# Patient Record
Sex: Male | Born: 1955 | Race: White | Hispanic: No | Marital: Married | Smoking: Never smoker
Health system: Southern US, Community
[De-identification: ages and names within clinical notes are randomized; demographics above are authoritative.]

## PROBLEM LIST (undated history)

## (undated) DIAGNOSIS — E785 Hyperlipidemia, unspecified: Secondary | ICD-10-CM

## (undated) DIAGNOSIS — I509 Heart failure, unspecified: Secondary | ICD-10-CM

## (undated) DIAGNOSIS — G4730 Sleep apnea, unspecified: Secondary | ICD-10-CM

## (undated) DIAGNOSIS — I10 Essential (primary) hypertension: Secondary | ICD-10-CM

## (undated) DIAGNOSIS — G47419 Narcolepsy without cataplexy: Secondary | ICD-10-CM

## (undated) DIAGNOSIS — G629 Polyneuropathy, unspecified: Secondary | ICD-10-CM

## (undated) HISTORY — PX: OTHER SURGICAL HISTORY: SHX169

## (undated) HISTORY — PX: TARSAL TUNNEL RELEASE: SUR1099

## (undated) HISTORY — DX: Hyperlipidemia, unspecified: E78.5

## (undated) HISTORY — PX: TONSILLECTOMY: SUR1361

## (undated) HISTORY — PX: NASAL SEPTUM SURGERY: SHX37

## (undated) HISTORY — PX: BICEPS TENDON REPAIR: SHX566

## (undated) HISTORY — PX: VEIN LIGATION AND STRIPPING: SHX2653

## (undated) HISTORY — PX: UVULECTOMY: SHX2631

## (undated) HISTORY — DX: Heart failure, unspecified: I50.9

---

## 2004-04-09 ENCOUNTER — Ambulatory Visit: Payer: Self-pay | Admitting: Unknown Physician Specialty

## 2004-04-09 ENCOUNTER — Ambulatory Visit (HOSPITAL_BASED_OUTPATIENT_CLINIC_OR_DEPARTMENT_OTHER): Admission: RE | Admit: 2004-04-09 | Discharge: 2004-04-09 | Payer: Self-pay | Admitting: Unknown Physician Specialty

## 2004-04-09 ENCOUNTER — Encounter: Payer: Self-pay | Admitting: Pulmonary Disease

## 2005-03-31 ENCOUNTER — Encounter: Payer: Self-pay | Admitting: Pulmonary Disease

## 2005-08-16 ENCOUNTER — Encounter: Payer: Self-pay | Admitting: Pulmonary Disease

## 2005-10-06 ENCOUNTER — Encounter: Payer: Self-pay | Admitting: Pulmonary Disease

## 2006-03-06 ENCOUNTER — Encounter: Admission: RE | Admit: 2006-03-06 | Discharge: 2006-03-06 | Payer: Self-pay | Admitting: Family Medicine

## 2006-06-13 ENCOUNTER — Encounter: Payer: Self-pay | Admitting: Cardiology

## 2006-07-13 ENCOUNTER — Ambulatory Visit: Payer: Self-pay | Admitting: Gastroenterology

## 2006-07-28 ENCOUNTER — Ambulatory Visit: Payer: Self-pay | Admitting: Gastroenterology

## 2006-09-26 ENCOUNTER — Ambulatory Visit (HOSPITAL_COMMUNITY): Admission: RE | Admit: 2006-09-26 | Discharge: 2006-09-27 | Payer: Self-pay | Admitting: Orthopedic Surgery

## 2008-05-14 ENCOUNTER — Ambulatory Visit: Payer: Self-pay | Admitting: Pulmonary Disease

## 2008-05-14 DIAGNOSIS — G4710 Hypersomnia, unspecified: Secondary | ICD-10-CM | POA: Insufficient documentation

## 2008-05-14 DIAGNOSIS — Z9189 Other specified personal risk factors, not elsewhere classified: Secondary | ICD-10-CM | POA: Insufficient documentation

## 2008-05-14 DIAGNOSIS — J301 Allergic rhinitis due to pollen: Secondary | ICD-10-CM | POA: Insufficient documentation

## 2008-05-14 DIAGNOSIS — I10 Essential (primary) hypertension: Secondary | ICD-10-CM | POA: Insufficient documentation

## 2008-05-14 DIAGNOSIS — G4733 Obstructive sleep apnea (adult) (pediatric): Secondary | ICD-10-CM | POA: Insufficient documentation

## 2008-05-14 DIAGNOSIS — E785 Hyperlipidemia, unspecified: Secondary | ICD-10-CM | POA: Insufficient documentation

## 2008-05-16 ENCOUNTER — Telehealth: Payer: Self-pay | Admitting: Pulmonary Disease

## 2008-05-19 ENCOUNTER — Telehealth: Payer: Self-pay | Admitting: Pulmonary Disease

## 2008-06-25 ENCOUNTER — Telehealth (INDEPENDENT_AMBULATORY_CARE_PROVIDER_SITE_OTHER): Payer: Self-pay | Admitting: *Deleted

## 2009-05-17 ENCOUNTER — Encounter: Payer: Self-pay | Admitting: Cardiology

## 2009-05-18 ENCOUNTER — Encounter: Payer: Self-pay | Admitting: Cardiology

## 2009-05-19 ENCOUNTER — Encounter: Payer: Self-pay | Admitting: Cardiology

## 2009-05-20 ENCOUNTER — Encounter: Payer: Self-pay | Admitting: Cardiology

## 2009-05-21 ENCOUNTER — Encounter: Payer: Self-pay | Admitting: Cardiology

## 2009-06-04 ENCOUNTER — Encounter: Admission: RE | Admit: 2009-06-04 | Discharge: 2009-06-04 | Payer: Self-pay | Admitting: Unknown Physician Specialty

## 2009-06-18 ENCOUNTER — Ambulatory Visit: Payer: Self-pay | Admitting: Cardiology

## 2009-06-18 DIAGNOSIS — I4510 Unspecified right bundle-branch block: Secondary | ICD-10-CM | POA: Insufficient documentation

## 2009-07-13 ENCOUNTER — Ambulatory Visit: Payer: Self-pay

## 2009-07-13 ENCOUNTER — Ambulatory Visit (HOSPITAL_COMMUNITY): Admission: RE | Admit: 2009-07-13 | Discharge: 2009-07-13 | Payer: Self-pay | Admitting: Cardiology

## 2009-07-13 ENCOUNTER — Ambulatory Visit: Payer: Self-pay | Admitting: Cardiovascular Disease

## 2009-07-13 ENCOUNTER — Encounter: Payer: Self-pay | Admitting: Cardiology

## 2010-03-02 NOTE — Letter (Signed)
Summary: [facility] Services [facility] Information Update   Health Services Clinic Information Update   Imported By: Roderic Ovens 06/25/2009 15:59:08  _____________________________________________________________________  External Attachment:    Type:   Image     Comment:   External Document

## 2010-03-02 NOTE — Assessment & Plan Note (Signed)
Summary: np6/abnormal ekg/jss   Referring Provider:  Dr. Theadora Rama Primary Provider:  Dr. Theadora Rama  CC:  new patient.  abnormal ekg.  Pt states he is tired but this is the norm for him.  He takes a caffeine supplement every morning.  Marland Kitchen  History of Present Illness: 55 yo with history of HTN, hyperlipidemia, and OSA presents for evaluation of new RBBB on ECG.  This was noted at his yearly physical.  Prior ECG from 2008 showed normal QRS duration.  Patient has excellent exercise tolerance.  He does 30 minutes of cardiovascular exercise a day (walks on treadmill, uses elliptical, lifts weights).  He denies exertional dyspnea or chest pain.  No presyncope/syncope or palpitations.  His main problem has been long-standing (years) of feeling tired and sleepy during the day. He has seen multiple specialists and, despite treatment of OSA, has had no relief.    ECG: NSR, LAFB, RBBB  Labs (4/11): K 3.9, creatinine 1.4, LDL 114, HDL 56, TSH normal, HCT 44.6  Current Medications (verified): 1)  Allegra-D 12 Hour 60-120 Mg Xr12h-Tab (Fexofenadine-Pseudoephedrine) .... 1/2 Every Am 2)  Hyzaar 50-12.5 Mg Tabs (Losartan Potassium-Hctz) .Marland Kitchen.. 1 Every Am 3)  Nasonex 50 Mcg/act Susp (Mometasone Furoate) .Marland Kitchen.. 1 To 2 Sprays Each Nostril Every Am 4)  Androgel Pump 1 % Gel (Testosterone) .... As Directed Every Other Day 5)  Chest Congestion Relief 400 Mg Tabs (Guaifenesin) .Marland Kitchen.. 1 Two Times A Day As Needed 6)  Afrin Nasal Spray 0.05 % Soln (Oxymetazoline Hcl) .Marland Kitchen.. 1 Spray Each Nostril At Bedtime 7)  Aspirin Adult Low Strength 81 Mg Tbec (Aspirin) .Marland Kitchen.. 1 Once Daily 8)  Caffeine 400 Mg .Marland Kitchen.. 1 Every Am 9)  Saw Palmetto Complex  Caps (Zn-Pyg Afri-Nettle-Saw Palmet) .Marland Kitchen.. 1 Two Times A Day 10)  Ra Glucosamine-Chondroit-Msm 500-400-300 Mg Tabs (Glucosamine-Chondroitin-Msm) .Marland Kitchen.. 1 Two Times A Day 11)  Cpap 13 .... At Bedtime 12)  Sonata 10 Mg Caps (Zaleplon) .... Every Evening 13)  Trazodone Hcl 50 Mg Tabs  (Trazodone Hcl) .... Every Evening  Allergies (verified): No Known Drug Allergies  Past History:  Past Medical History: 1. Excessive Daytime Sleepiness: This has been a very difficult issue for years now. He has tried multiple interventions with multiple specialties but continues to have difficulty with sleepiness/fatigue.  2. ALLERGIC RHINITIS, SEASONAL (ICD-477.0) 3. HEADACHE, CHRONIC, HX OF (ICD-V15.9) 4. OBSTRUCTIVE SLEEP APNEA (ICD-327.23): Uses CPAP.  S/p uvalectomy and tonsillectomy.  5. HYPERTENSION (ICD-401.9) 6. HYPERLIPIDEMIA  7. ETT-cardiolite in 2004: EF 62%, no ischemia or infarction.   Family History: Allergies- Father No premature CAD.   Social History: Married with children, lives in [place] ETOH 2 x per wk Never smoked  Review of Systems       All systems reviewed and negative except as per HPI.   Vital Signs:  Patient profile:   55 year old male Height:      72 inches Weight:      279 pounds BMI:     37.98 Pulse rate:   82 / minute Pulse rhythm:   regular BP sitting:   130 / 96  (left arm) Cuff size:   large  Vitals Entered By: Judithe Modest CMA (Jun 18, 2009 11:15 AM)  Physical Exam  General:  Well developed, well nourished, in no acute distress.  Obese.  Head:  normocephalic and atraumatic Nose:  no deformity, discharge, inflammation, or lesions Mouth:  Teeth, gums and palate normal. Oral mucosa normal. Neck:  Neck thick, no JVD. No masses, thyromegaly or abnormal cervical nodes. Lungs:  Clear bilaterally to auscultation and percussion. Heart:  Non-displaced PMI, chest non-tender; regular rate and rhythm, S1, S2 without murmurs, rubs or gallops. Carotid upstroke normal, no bruit. Pedals normal pulses. No edema, no varicosities. Abdomen:  Bowel sounds positive; abdomen soft and non-tender without masses, organomegaly, or hernias noted. No hepatosplenomegaly. Msk:  Back normal, normal gait. Muscle strength and tone  normal. Extremities:  No clubbing or cyanosis. Neurologic:  Alert and oriented x 3. Skin:  Intact without lesions or rashes. Psych:  Normal affect.   Impression & Recommendations:  Problem # 1:  RIGHT BUNDLE BRANCH BLOCK (ICD-426.4) This is new compared to 2008.  Patient has no significant cardiovascular symptoms.  I suspect that this is a relatively benign finding (RBBB is not uncommon and in the absence of symptoms only gives a mildly increased risk of adverse cardiac events).  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  Would suggest echo to make sure that the heart is structurally normal.  Need to monitor with periodic (yearly) ECGs.  No further workup unless additional symptoms develop.    Other Orders: EKG w/ Interpretation (93000) Echocardiogram (Echo)  Patient Instructions: 1)  Your physician has requested that you have an echocardiogram.  Echocardiography is a painless test that uses sound waves to create images of your heart. It provides your doctor with information about the size and shape of your heart and how well your heart's chambers and valves are working.  This procedure takes approximately one hour. There are no restrictions for this procedure. 2)  Your physician recommends that you schedule a follow-up appointment as needed with Dr Shirlee Latch.

## 2010-03-02 NOTE — Letter (Signed)
Summary: Douglas Khan Physician Exam Report  [facility] Physician Exam Report   Imported By: Roderic Ovens 07/20/2009 10:02:36  _____________________________________________________________________  External Attachment:    Type:   Image     Comment:   External Document

## 2010-06-15 NOTE — Op Note (Signed)
Douglas Khan, CRESS NO.:  [id number]   MEDICAL RECORD NO.:  [id number]          PATIENT TYPE:  AMB   LOCATION:  SDS                          FACILITY:  [facility]   PHYSICIAN:  Dionne Ano. Gramig III, M.D.DATE OF BIRTH:  02-14-1955   DATE OF PROCEDURE:  DATE OF DISCHARGE:                               OPERATIVE REPORT   PREOPERATIVE DIAGNOSIS:  Distal biceps tendon rupture, right upper  extremity.   POSTOPERATIVE DIAGNOSIS:  Distal biceps tendon rupture, right upper  extremity.   PROCEDURE:  1. Tenolysis, right distal biceps tendon.  2. Repair distal biceps tendon rupture with EndoButton technique.  3. Stress radiography, right upper extremity.   ASSISTANT:  Karie Chimera, P.A.-C.   COMPLICATIONS:  None.   ANESTHESIA:  General.   TOURNIQUET TIME:  Less than an hour.   INDICATIONS FOR PROCEDURE:  This patient is a pleasant male who presents  the above-mentioned diagnosis.  He ruptured his tendon two weeks and two  days ago.  He has 8 cm of retraction.  I have discussed with him the  loss of supination, endurance and strength as well as flexion strength  with a chronic biceps rupture.  He understands the risks and benefits of  surgery and desires to proceed with the operative intervention noted  above.  I have discussed with him the risk of posterior interosseous  nerve injury, bleeding, infection, anesthesia and damage to normal  structures.  With all issues in mind, he desires to proceed and we will  proceed accordingly.   OPERATION IN DETAIL:  The patient was administered anesthesia.  He was  marked about the operative site.  He underwent prophylactic antibiotic  administration and was consented.  Following this, he was taken to the  procedural suite and underwent a general anesthetic.  He was laid supine  and properly padded.  He was prepped and draped in the sterile fashion  with Betadine scrub and paint about the right upper extremity.  Following  this, a sterile tourniquet was applied.  The tourniquet was  then insufflated to 250 mmHg.   An incision was made about the proximal forearm between the flexor and  extensor regions.  This was a volar Sherilyn Cooter approach well proximally.  The  patient had crossing veins taken down.  The lateral antebrachial  cutaneous nerve was carefully identified and protected.  It was swept  laterally during the course of the dissection.  I then dissected and  identified the distal biceps tendon rupture.  The biceps tendon was able  to gathered and freed.  I performed an extensive tenolysis of the tendon  and following this I placed a #2 FiberWire Krackow suture.  At the  distal end, an EndoButton was sutured for repair purposes/reinsertion  into the proximal radius.   At this time, two kite string sutures were placed in the outside eyelets  of the EndoButton and attention was turned towards the insertion.  Careful dissection was carried out and dissection about the crossing  veins and arteries was accomplished.  Ligaclips were placed about the  crossing veins and arterial  structures and the bony region about the  radius was then identified where the rupture occurred.  I very carefully  dissected in this region.  Following this, I placed a small bur about  the area for reinsertion.  The patient tolerated this well.  Following  this, I created an area for reinsertion and then placed a Beath pin.  The Beath pin was placed through the far cortex and then tapped through.  Following this, I then placed a reamer over the Beath pin to ream the  outside cortex to allow for EndoButton placement.  This was done to my  satisfaction without difficulty.  I then placed a kite string sutures  previously placed through the EndoButton through the eyelet of the Beath  pin and advanced the Beath pin into the distal radius insertion site of  the tendon, of course.  The patient tolerated this well.  I then placed  live fluoro  in the field and engaged the EndoButton.  One suture was  pulled through followed by the second.  This was done without difficulty  and the EndoButton seated beautifully.  The tendon had an excellent  repair.  I was pleased with this and the findings.   Following this, I placed 5 mL of Marcaine in the biceps muscle belly as  I wanted to prevent spasm.  The patient had the tourniquet deflated.  Hemostasis was obtained with bipolar electrocautery.  Assuring  hemostasis and irrigating copiously, the patient then had the wound  closed with Vicryl followed by subcuticular Prolene.  Steri-Strips were  then applied.  The exit site for the Beath pin needed no incision  closure, etc.  The patient had an excellent pulse.  I should note that  during the preparation of the insertion site the patient had copious  irrigation applied to lessen the risk of bone dust in the vicinity.  The  patient tolerated the procedure quite well and there were no  complicating features.   He was dressed sterilely and placed a long-arm splint.  All sponge,  needle and instrument counts were noted to be correct.  In the recovery  room, he was stable.  He will be monitored overnight and be given IV  antibiotics, pain management and general postop observation and measures  according to our protocol.   It has been an absolute pleasure to participate in his care and we look  forward to participating in his postop recovery.           ______________________________  Dionne Ano. Everlene Other, M.D.     Douglas Khan  D:  09/26/2006  T:  09/27/2006  Job:  [id number]

## 2010-06-18 NOTE — Procedures (Signed)
NAME:  Douglas Khan, Douglas Khan NO.:  [id number]   MEDICAL RECORD NO.:  [id number]          PATIENT TYPE:  OUT   LOCATION:  SLEEP CENTER                 FACILITY:  [facility]   PHYSICIAN:  Clinton D. Maple Hudson, M.D. DATE OF BIRTH:  Jan 08, 1956   DATE OF STUDY:  04/09/2004                              NOCTURNAL POLYSOMNOGRAM   REFERRING PHYSICIAN:  Dr. Theadora Rama.   INDICATIONS FOR STUDY:  Hypersomnia with sleep apnea. Epworth sleepiness  score 14/24, BMI 33, weight 250 pounds, neck size 17 inches. Past history of  surgical therapy for sleep apnea.   SLEEP ARCHITECTURE:  Total sleep time 421 minutes with sleep efficiency of  90%. Stage I was 9%, stage II was 57%, stages III and IV were 14%, REM was  20% of total sleep time. Sleep latency 9.5 minutes. REM latency 100.5  minutes. Awake after sleep onset 40 minutes. Arousal index 24. No  medications were recorded for sleep.   RESPIRATORY DATA:  Split study protocol. Respiratory disturbance index (RDI)  21.4 obstructive events per hour indicating moderate obstructive sleep  apnea/hypopnea before CPAP. This included 60 hypopneas before CPAP. Most  sleep was supine. REM RDI 16.4. CPAP was then titrated to a recommended  pressure of 9 CWP, RDI 3.7 per hour. A small Respironics Comfort Classic  nasal mask was used with a heated humidifier. The technician tried 10 CWP  which generated an RDI of 10.8 and may have begun to induce some vasomotor  nasal congestion with a higher air flow.   OXYGEN DATA:  Moderate snoring with oxygen desaturation to a nadir of 88%  before CPAP. After CPAP control saturation held 95% to 97% on room air.   CARDIAC DATA:  Normal sinus rhythm.   MOVEMENT/PARASOMNIA:  Occasional leg jerks with little effect on sleep.   IMPRESSION/RECOMMENDATIONS:  1.  Moderate obstructive sleep apnea/hypopnea syndrome, RDI 21.4 per hour      with oxygen desaturation to 88% and moderate snoring.  2.  Successful CPAP titration  to 9 CWP.  A small Respironics Comfort Classic      nasal mask was used with heated humidifier.    CDY/MEDQ  D:  04/18/2004 10:02:49  T:  04/19/2004 08:24:19  Job:  [id number]

## 2010-11-12 LAB — CBC
HCT: 47.4
Hemoglobin: 16.7
MCHC: 35.3
MCV: 89.8
Platelets: 265
RBC: 5.28
RDW: 12.6
WBC: 8.2

## 2010-11-12 LAB — URINALYSIS, ROUTINE W REFLEX MICROSCOPIC
Bilirubin Urine: NEGATIVE
Glucose, UA: NEGATIVE
Hgb urine dipstick: NEGATIVE
Ketones, ur: NEGATIVE
Protein, ur: NEGATIVE
Specific Gravity, Urine: 1.014
Urobilinogen, UA: 0.2

## 2010-11-12 LAB — BASIC METABOLIC PANEL
GFR calc non Af Amer: 53 — ABNORMAL LOW
Glucose, Bld: 100 — ABNORMAL HIGH

## 2011-06-10 ENCOUNTER — Ambulatory Visit
Admission: RE | Admit: 2011-06-10 | Discharge: 2011-06-10 | Disposition: A | Discharge status: Home / Self Care | Payer: BC Managed Care – PPO | Source: Ambulatory Visit | Attending: Diagnostic Neuroimaging | Admitting: Diagnostic Neuroimaging

## 2011-06-10 ENCOUNTER — Other Ambulatory Visit: Payer: Self-pay | Admitting: Diagnostic Neuroimaging

## 2011-06-10 DIAGNOSIS — G589 Mononeuropathy, unspecified: Secondary | ICD-10-CM

## 2011-09-07 ENCOUNTER — Encounter (HOSPITAL_COMMUNITY): Payer: Self-pay | Admitting: *Deleted

## 2011-09-07 NOTE — H&P (Signed)
Douglas Khan is an 56 y.o. male.   Chief Complaint: left knee pain HPI: The patient is a 56 year old male who presents with left knee pain. He reports that on Saturday September 03, 2011 he was walking down a grassy hill when he slipped and fell on both knees, hyperflexing both of them. He reports that he immediately had pain in the left knee and heard a loud pop. He has since had pain and swelling in the knee, and has been unable to extend it and bear weight on it. He has had some pain in the right knee but it is starting to fell better already. No swelling or instability in the right knee. No previous issues in the knees. He denies groin or lateral hip pain. No back pain. No numbness or tingling in the legs. Since then he has been seen at [facility] ED where they diagnosed him with a quad tendon tear in the left knee. He was placed in a knee immobilizer and on crutches.    Past Medical History Peripheral Neuropathy Sleep Apnea- uses CPAP Hypertension Hypercholesteremia Tarsal Tunnel Syndrome Chronic Pain Allergic Rhinitis Right Bundle Branch Block  Surgical History Straighten Nasal Septum Tonsillectomy Leg Circulation Surgery. bilateral Ankle Surgery. bilateral Foot Surgery. bilateral Vasectomy  Social History: Patient is a non-smoker who has never smoked. He occasionally drinks alcohol.   Allergies: NKDA  Medications TraMADol HCl (50MG  Tablet, Oral) Active AndroGel (50MG /5GM Gel, Transdermal) Active. Cialis (5MG  Tablet, Oral) Active. Mucinex (600MG  Tablet ER, Oral) Active. Nasonex (50MCG/ACT Suspension, Nasal) Active. Afrin Saline Nasal Mist (0.65% Solution, Nasal) Active. Losartan Potassium-HCTZ (50-12.5MG  Tablet, Oral) Active. Sonata (10MG  Capsule, Oral) Active. Adderall XR (15MG  Capsule ER 24HR, Oral) Active. Losartan Potassium (25MG  Tablet, Oral) Active. Aspirin EC (81MG  Tablet DR, Oral two times daily) Active.  Review of Systems  Constitutional: Negative.   HENT:  Negative.  Negative for neck pain.   Eyes: Negative.   Respiratory: Negative.   Cardiovascular: Negative.   Gastrointestinal: Negative.   Genitourinary: Negative.   Musculoskeletal: Positive for joint pain and falls. Negative for myalgias and back pain.  Skin: Negative.   Neurological: Negative.   Endo/Heme/Allergies: Negative.   Psychiatric/Behavioral: Negative.    Vitals BP: 143/87 HR: 82 RR: 18 Wt: 275 lbs Ht: 71 in BMI: 38.35  Physical Exam  Constitutional: He is oriented to person, place, and time. He appears well-developed and well-nourished. No distress.  HENT:  Head: Normocephalic and atraumatic.  Right Ear: External ear normal.  Left Ear: External ear normal.  Nose: Nose normal.  Mouth/Throat: Oropharynx is clear and moist.  Eyes: Conjunctivae and EOM are normal.  Neck: Normal range of motion. Neck supple. No tracheal deviation present. No thyromegaly present.  Cardiovascular: Normal rate, regular rhythm, normal heart sounds and intact distal pulses.   No murmur heard. Respiratory: Effort normal and breath sounds normal. No respiratory distress. He has no wheezes. He has no rales. He exhibits no tenderness.  GI: Soft. Bowel sounds are normal. He exhibits no distension and no mass. There is no tenderness. There is no rebound and no guarding.  Musculoskeletal:       Right hip: Normal.       Left hip: Normal.       Right knee: He exhibits normal range of motion, no swelling and no effusion. no tenderness found.       Left knee: He exhibits decreased range of motion, swelling, effusion and ecchymosis. tenderness found.       Right  ankle: Normal.       Left ankle: Normal.       Legs:      Feet:  Neurological: He is alert and oriented to person, place, and time. He has normal strength. No cranial nerve deficit or sensory deficit.  Skin: No rash noted. He is not diaphoretic. No erythema.  Psychiatric: He has a normal mood and affect. His behavior is normal. Judgment  and thought content normal.     Assessment/Plan He has a quadriceps tendon tear in the left knee. It will need surgical intervention. X-rays performed in the office showed no fractures in the left knee. We are going to get an MRI of the left knee this evening before surgery. Dr. Darrelyn Hillock will repair the left quadriceps tendon with tissue mend graft on Thursday September 08, 2011. The patient was instructed to remain in the knee immobilizer and crutches and to be NPO after midnight tonight.   Jonice Cerra LAUREN 09/07/2011, 4:32 PM

## 2011-09-08 ENCOUNTER — Ambulatory Visit (HOSPITAL_COMMUNITY): Payer: BC Managed Care – PPO | Admitting: Anesthesiology

## 2011-09-08 ENCOUNTER — Inpatient Hospital Stay (HOSPITAL_COMMUNITY)
Admission: RE | Admit: 2011-09-08 | Discharge: 2011-09-10 | DRG: 227 | Disposition: A | Discharge status: Home / Self Care | Payer: BC Managed Care – PPO | Source: Ambulatory Visit | Attending: Orthopedic Surgery | Admitting: Orthopedic Surgery

## 2011-09-08 ENCOUNTER — Encounter (HOSPITAL_COMMUNITY): Payer: Self-pay | Admitting: Anesthesiology

## 2011-09-08 ENCOUNTER — Encounter (HOSPITAL_COMMUNITY)
Admission: RE | Disposition: A | Discharge status: Home / Self Care | Payer: Self-pay | Source: Ambulatory Visit | Attending: Orthopedic Surgery

## 2011-09-08 DIAGNOSIS — Y9289 Other specified places as the place of occurrence of the external cause: Secondary | ICD-10-CM

## 2011-09-08 DIAGNOSIS — W19XXXA Unspecified fall, initial encounter: Secondary | ICD-10-CM | POA: Diagnosis present

## 2011-09-08 DIAGNOSIS — S76119A Strain of unspecified quadriceps muscle, fascia and tendon, initial encounter: Secondary | ICD-10-CM

## 2011-09-08 DIAGNOSIS — S86819A Strain of other muscle(s) and tendon(s) at lower leg level, unspecified leg, initial encounter: Secondary | ICD-10-CM | POA: Diagnosis present

## 2011-09-08 DIAGNOSIS — S838X9A Sprain of other specified parts of unspecified knee, initial encounter: Principal | ICD-10-CM | POA: Diagnosis present

## 2011-09-08 DIAGNOSIS — I10 Essential (primary) hypertension: Secondary | ICD-10-CM | POA: Diagnosis present

## 2011-09-08 DIAGNOSIS — G5790 Unspecified mononeuropathy of unspecified lower limb: Secondary | ICD-10-CM | POA: Diagnosis present

## 2011-09-08 DIAGNOSIS — G4730 Sleep apnea, unspecified: Secondary | ICD-10-CM | POA: Diagnosis present

## 2011-09-08 HISTORY — DX: Essential (primary) hypertension: I10

## 2011-09-08 HISTORY — DX: Narcolepsy without cataplexy: G47.419

## 2011-09-08 HISTORY — DX: Polyneuropathy, unspecified: G62.9

## 2011-09-08 HISTORY — DX: Sleep apnea, unspecified: G47.30

## 2011-09-08 HISTORY — PX: QUADRICEPS TENDON REPAIR: SHX756

## 2011-09-08 LAB — CBC
HCT: 43.7 % (ref 39.0–52.0)
Hemoglobin: 15.9 g/dL (ref 13.0–17.0)
MCH: 31.4 pg (ref 26.0–34.0)
MCHC: 36.4 g/dL — ABNORMAL HIGH (ref 30.0–36.0)
MCV: 86.4 fL (ref 78.0–100.0)
Platelets: 204 10*3/uL (ref 150–400)
RBC: 5.06 MIL/uL (ref 4.22–5.81)
RDW: 12.7 % (ref 11.5–15.5)
WBC: 8.3 10*3/uL (ref 4.0–10.5)

## 2011-09-08 LAB — COMPREHENSIVE METABOLIC PANEL
ALT: 29 U/L (ref 0–53)
AST: 47 U/L — ABNORMAL HIGH (ref 0–37)
Albumin: 3.8 g/dL (ref 3.5–5.2)
Alkaline Phosphatase: 65 U/L (ref 39–117)
BUN: 22 mg/dL (ref 6–23)
CO2: 22 mEq/L (ref 19–32)
Calcium: 9.1 mg/dL (ref 8.4–10.5)
Chloride: 102 mEq/L (ref 96–112)
Creatinine, Ser: 1.09 mg/dL (ref 0.50–1.35)
GFR calc Af Amer: 85 mL/min — ABNORMAL LOW (ref >90)
GFR calc non Af Amer: 73 mL/min — ABNORMAL LOW (ref >90)
Glucose, Bld: 102 mg/dL — ABNORMAL HIGH (ref 70–99)
Potassium: 3.5 mEq/L (ref 3.5–5.1)
Sodium: 136 mEq/L (ref 135–145)
Total Bilirubin: 1.2 mg/dL (ref 0.3–1.2)
Total Protein: 6.5 g/dL (ref 6.0–8.3)

## 2011-09-08 LAB — TYPE AND SCREEN
ABO/RH(D): O POS
Antibody Screen: NEGATIVE

## 2011-09-08 LAB — ABO/RH: ABO/RH(D): O POS

## 2011-09-08 LAB — PROTIME-INR
INR: 1.07 (ref 0.00–1.49)
Prothrombin Time: 14.1 seconds (ref 11.6–15.2)

## 2011-09-08 LAB — URINALYSIS, ROUTINE W REFLEX MICROSCOPIC
Bilirubin Urine: NEGATIVE
Glucose, UA: NEGATIVE mg/dL
Hgb urine dipstick: NEGATIVE
Ketones, ur: NEGATIVE mg/dL
Leukocytes, UA: NEGATIVE
Nitrite: NEGATIVE
Protein, ur: NEGATIVE mg/dL
Specific Gravity, Urine: 1.03 (ref 1.005–1.030)
Urobilinogen, UA: 0.2 mg/dL (ref 0.0–1.0)
pH: 5.5 (ref 5.0–8.0)

## 2011-09-08 LAB — APTT: aPTT: 34 seconds (ref 24–37)

## 2011-09-08 SURGERY — REPAIR, TENDON, QUADRICEPS
Anesthesia: General | Site: Leg Upper | Laterality: Left | Wound class: Clean

## 2011-09-08 MED ORDER — GELATIN ABSORBABLE MT POWD
OROMUCOSAL | Status: DC | PRN
  Administered 2011-09-08 (×2): via TOPICAL

## 2011-09-08 MED ORDER — BACITRACIN ZINC 500 UNIT/GM EX OINT
TOPICAL_OINTMENT | CUTANEOUS | Status: AC
  Filled 2011-09-08: qty 15

## 2011-09-08 MED ORDER — LACTATED RINGERS IV SOLN
INTRAVENOUS | Status: DC
  Administered 2011-09-08: via INTRAVENOUS
  Administered 2011-09-09: 20 mL/h via INTRAVENOUS

## 2011-09-08 MED ORDER — HYDROMORPHONE HCL PF 1 MG/ML IJ SOLN
INTRAMUSCULAR | Status: AC
  Administered 2011-09-09: 1 mg via INTRAVENOUS
  Filled 2011-09-08: qty 1

## 2011-09-08 MED ORDER — HYDROCHLOROTHIAZIDE 12.5 MG PO CAPS
12.5000 mg | ORAL_CAPSULE | Freq: Every day | ORAL | Status: DC
  Administered 2011-09-09: 12.5 mg via ORAL
  Filled 2011-09-08 (×2): qty 1

## 2011-09-08 MED ORDER — ACETAMINOPHEN 10 MG/ML IV SOLN
INTRAVENOUS | Status: DC | PRN
  Administered 2011-09-08: 1000 mg via INTRAVENOUS

## 2011-09-08 MED ORDER — METHOCARBAMOL 100 MG/ML IJ SOLN
500.0000 mg | Freq: Four times a day (QID) | INTRAVENOUS | Status: DC | PRN
  Filled 2011-09-08: qty 5

## 2011-09-08 MED ORDER — AMPHETAMINE-DEXTROAMPHET ER 5 MG PO CP24
15.0000 mg | ORAL_CAPSULE | Freq: Every day | ORAL | Status: DC
  Filled 2011-09-08 (×2): qty 3

## 2011-09-08 MED ORDER — TESTOSTERONE 50 MG/5GM (1%) TD GEL
5.0000 g | Freq: Every day | TRANSDERMAL | Status: DC
  Filled 2011-09-08 (×2): qty 5

## 2011-09-08 MED ORDER — HYDROCODONE-ACETAMINOPHEN 5-325 MG PO TABS
1.0000 | ORAL_TABLET | ORAL | Status: DC | PRN
  Administered 2011-09-08 – 2011-09-10 (×8): 2 via ORAL
  Filled 2011-09-08 (×8): qty 2

## 2011-09-08 MED ORDER — HYDROMORPHONE HCL PF 1 MG/ML IJ SOLN
1.0000 mg | INTRAMUSCULAR | Status: DC | PRN
  Administered 2011-09-08 – 2011-09-09 (×2): 1 mg via INTRAVENOUS
  Filled 2011-09-08: qty 1

## 2011-09-08 MED ORDER — CEFAZOLIN SODIUM 1-5 GM-% IV SOLN
INTRAVENOUS | Status: AC
  Filled 2011-09-08: qty 50

## 2011-09-08 MED ORDER — LOSARTAN POTASSIUM 50 MG PO TABS
50.0000 mg | ORAL_TABLET | Freq: Every day | ORAL | Status: DC
  Administered 2011-09-09: 50 mg via ORAL
  Filled 2011-09-08 (×2): qty 1

## 2011-09-08 MED ORDER — ACETAMINOPHEN 650 MG RE SUPP: 650.0000 mg | Freq: Four times a day (QID) | RECTAL | Status: DC | PRN

## 2011-09-08 MED ORDER — 0.9 % SODIUM CHLORIDE (POUR BTL) OPTIME
TOPICAL | Status: DC | PRN
  Administered 2011-09-08: 250 mL

## 2011-09-08 MED ORDER — BUPIVACAINE LIPOSOME 1.3 % IJ SUSP
20.0000 mL | Freq: Once | INTRAMUSCULAR | Status: AC
  Administered 2011-09-08: 19.99999999999999999 mL
  Filled 2011-09-08: qty 20

## 2011-09-08 MED ORDER — LOSARTAN POTASSIUM-HCTZ 50-12.5 MG PO TABS: 1.0000 | ORAL_TABLET | Freq: Every morning | ORAL | Status: DC

## 2011-09-08 MED ORDER — ONDANSETRON HCL 4 MG/2ML IJ SOLN
INTRAMUSCULAR | Status: DC | PRN
  Administered 2011-09-08: 4 mg via INTRAVENOUS

## 2011-09-08 MED ORDER — PHENOL 1.4 % MT LIQD: 1.0000 | OROMUCOSAL | Status: DC | PRN

## 2011-09-08 MED ORDER — HYDROMORPHONE HCL PF 1 MG/ML IJ SOLN
0.2500 mg | INTRAMUSCULAR | Status: DC | PRN
  Administered 2011-09-08 (×2): 0.5 mg via INTRAVENOUS

## 2011-09-08 MED ORDER — SUCCINYLCHOLINE CHLORIDE 20 MG/ML IJ SOLN
INTRAMUSCULAR | Status: DC | PRN
  Administered 2011-09-08: 100 mg via INTRAVENOUS

## 2011-09-08 MED ORDER — OXYMETAZOLINE HCL 0.05 % NA SOLN
2.0000 | Freq: Two times a day (BID) | NASAL | Status: DC
  Administered 2011-09-08 – 2011-09-09 (×2): 2 via NASAL
  Filled 2011-09-08: qty 15

## 2011-09-08 MED ORDER — BACITRACIN ZINC 500 UNIT/GM EX OINT
TOPICAL_OINTMENT | CUTANEOUS | Status: DC | PRN
  Administered 2011-09-08: 1 via TOPICAL

## 2011-09-08 MED ORDER — CEFAZOLIN SODIUM 10 G IJ SOLR
3.0000 g | INTRAMUSCULAR | Status: AC
  Administered 2011-09-08: 3 g via INTRAVENOUS
  Filled 2011-09-08: qty 3000

## 2011-09-08 MED ORDER — CEFAZOLIN SODIUM 1-5 GM-% IV SOLN
1.0000 g | Freq: Four times a day (QID) | INTRAVENOUS | Status: AC
  Administered 2011-09-08 – 2011-09-09 (×2): 1 g via INTRAVENOUS
  Filled 2011-09-08 (×2): qty 50

## 2011-09-08 MED ORDER — LACTATED RINGERS IV SOLN
INTRAVENOUS | Status: DC
  Administered 2011-09-08: 1000 mL via INTRAVENOUS
  Administered 2011-09-08: 18:00:00 via INTRAVENOUS

## 2011-09-08 MED ORDER — MUPIROCIN 2 % EX OINT
TOPICAL_OINTMENT | Freq: Two times a day (BID) | CUTANEOUS | Status: DC
Start: 2011-09-08 — End: 2011-09-08
  Filled 2011-09-08: qty 22

## 2011-09-08 MED ORDER — ONDANSETRON HCL 4 MG PO TABS: 4.0000 mg | ORAL_TABLET | Freq: Four times a day (QID) | ORAL | Status: DC | PRN

## 2011-09-08 MED ORDER — FENTANYL CITRATE 0.05 MG/ML IJ SOLN
INTRAMUSCULAR | Status: DC | PRN
  Administered 2011-09-08: 100 ug via INTRAVENOUS
  Administered 2011-09-08: 150 ug via INTRAVENOUS
  Administered 2011-09-08: 100 ug via INTRAVENOUS

## 2011-09-08 MED ORDER — CHLORHEXIDINE GLUCONATE 4 % EX LIQD
60.0000 mL | Freq: Once | CUTANEOUS | Status: DC
  Filled 2011-09-08: qty 60

## 2011-09-08 MED ORDER — LIDOCAINE HCL (CARDIAC) 20 MG/ML IV SOLN
INTRAVENOUS | Status: DC | PRN
  Administered 2011-09-08: 100 mg via INTRAVENOUS

## 2011-09-08 MED ORDER — ONDANSETRON HCL 4 MG/2ML IJ SOLN: 4.0000 mg | Freq: Four times a day (QID) | INTRAMUSCULAR | Status: DC | PRN

## 2011-09-08 MED ORDER — LACTATED RINGERS IV SOLN: INTRAVENOUS | Status: DC

## 2011-09-08 MED ORDER — OXYCODONE-ACETAMINOPHEN 5-325 MG PO TABS: 2.0000 | ORAL_TABLET | ORAL | Status: DC | PRN

## 2011-09-08 MED ORDER — PROMETHAZINE HCL 25 MG/ML IJ SOLN: 6.2500 mg | INTRAMUSCULAR | Status: DC | PRN

## 2011-09-08 MED ORDER — POLYETHYLENE GLYCOL 3350 17 G PO PACK: 17.0000 g | PACK | Freq: Every day | ORAL | Status: DC | PRN

## 2011-09-08 MED ORDER — LOSARTAN POTASSIUM 25 MG PO TABS
25.0000 mg | ORAL_TABLET | Freq: Every evening | ORAL | Status: DC
  Administered 2011-09-08 – 2011-09-09 (×2): 25 mg via ORAL
  Filled 2011-09-08 (×3): qty 1

## 2011-09-08 MED ORDER — SODIUM CHLORIDE 0.9 % IR SOLN
Status: DC | PRN
  Administered 2011-09-08: 16:00:00

## 2011-09-08 MED ORDER — PROPOFOL 10 MG/ML IV BOLUS
INTRAVENOUS | Status: DC | PRN
  Administered 2011-09-08: 50 mg via INTRAVENOUS
  Administered 2011-09-08: 300 mg via INTRAVENOUS

## 2011-09-08 MED ORDER — ALUM & MAG HYDROXIDE-SIMETH 200-200-20 MG/5ML PO SUSP: 30.0000 mL | ORAL | Status: DC | PRN

## 2011-09-08 MED ORDER — METHOCARBAMOL 500 MG PO TABS
500.0000 mg | ORAL_TABLET | Freq: Four times a day (QID) | ORAL | Status: DC | PRN
  Administered 2011-09-08 – 2011-09-10 (×4): 500 mg via ORAL
  Filled 2011-09-08 (×4): qty 1

## 2011-09-08 MED ORDER — KETAMINE HCL 10 MG/ML IJ SOLN
INTRAMUSCULAR | Status: DC | PRN
  Administered 2011-09-08: 30 mg via INTRAVENOUS
  Administered 2011-09-08 (×2): 10 mg via INTRAVENOUS

## 2011-09-08 MED ORDER — HYDROMORPHONE HCL PF 1 MG/ML IJ SOLN
INTRAMUSCULAR | Status: DC | PRN
  Administered 2011-09-08 (×5): .4 mg via INTRAVENOUS

## 2011-09-08 MED ORDER — CEFAZOLIN SODIUM-DEXTROSE 2-3 GM-% IV SOLR
INTRAVENOUS | Status: AC
  Filled 2011-09-08: qty 50

## 2011-09-08 MED ORDER — THROMBIN 5000 UNITS EX SOLR
CUTANEOUS | Status: AC
  Filled 2011-09-08: qty 10000

## 2011-09-08 MED ORDER — ACETAMINOPHEN 10 MG/ML IV SOLN
INTRAVENOUS | Status: AC
  Filled 2011-09-08: qty 100

## 2011-09-08 MED ORDER — AMPHETAMINE-DEXTROAMPHET ER 15 MG PO CP24: 15.0000 mg | ORAL_CAPSULE | ORAL | Status: DC

## 2011-09-08 MED ORDER — BISACODYL 10 MG RE SUPP: 10.0000 mg | Freq: Every day | RECTAL | Status: DC | PRN

## 2011-09-08 MED ORDER — RIVAROXABAN 10 MG PO TABS
10.0000 mg | ORAL_TABLET | Freq: Every day | ORAL | Status: DC
  Administered 2011-09-09 – 2011-09-10 (×2): 10 mg via ORAL
  Filled 2011-09-08 (×4): qty 1

## 2011-09-08 MED ORDER — FLUTICASONE PROPIONATE 50 MCG/ACT NA SUSP
1.0000 | Freq: Every day | NASAL | Status: DC
  Administered 2011-09-09: 1 via NASAL
  Filled 2011-09-08: qty 16

## 2011-09-08 MED ORDER — ACETAMINOPHEN 325 MG PO TABS: 650.0000 mg | ORAL_TABLET | Freq: Four times a day (QID) | ORAL | Status: DC | PRN

## 2011-09-08 MED ORDER — DEXAMETHASONE SODIUM PHOSPHATE 10 MG/ML IJ SOLN
INTRAMUSCULAR | Status: DC | PRN
  Administered 2011-09-08: 10 mg via INTRAVENOUS

## 2011-09-08 MED ORDER — MENTHOL 3 MG MT LOZG: 1.0000 | LOZENGE | OROMUCOSAL | Status: DC | PRN

## 2011-09-08 SURGICAL SUPPLY — 58 items
BAG SPEC THK2 15X12 ZIP CLS (MISCELLANEOUS) ×1
BAG ZIPLOCK 12X15 (MISCELLANEOUS) ×2 IMPLANT
BANDAGE ELASTIC 4 VELCRO ST LF (GAUZE/BANDAGES/DRESSINGS) ×2 IMPLANT
BANDAGE ELASTIC 6 VELCRO ST LF (GAUZE/BANDAGES/DRESSINGS) ×2 IMPLANT
BANDAGE GAUZE ELAST BULKY 4 IN (GAUZE/BANDAGES/DRESSINGS) ×2 IMPLANT
BIT DRILL 2.8X128 (BIT) ×2 IMPLANT
BNDG COHESIVE 4X5 TAN NS LF (GAUZE/BANDAGES/DRESSINGS) ×1 IMPLANT
CLOTH BEACON ORANGE TIMEOUT ST (SAFETY) ×2 IMPLANT
CLSR STERI-STRIP ANTIMIC 1/2X4 (GAUZE/BANDAGES/DRESSINGS) ×1 IMPLANT
CUFF TOURN SGL QUICK 34 (TOURNIQUET CUFF) ×2
CUFF TRNQT CYL 34X4X40X1 (TOURNIQUET CUFF) ×1 IMPLANT
DRAPE EXTREMITY T 121X128X90 (DRAPE) ×2 IMPLANT
DRAPE U-SHAPE 47X51 STRL (DRAPES) ×2 IMPLANT
DRSG ADAPTIC 3X8 NADH LF (GAUZE/BANDAGES/DRESSINGS) ×2 IMPLANT
DRSG PAD ABDOMINAL 8X10 ST (GAUZE/BANDAGES/DRESSINGS) ×4 IMPLANT
DURAPREP 26ML APPLICATOR (WOUND CARE) ×2 IMPLANT
ELECT REM PT RETURN 9FT ADLT (ELECTROSURGICAL) ×2
ELECTRODE REM PT RTRN 9FT ADLT (ELECTROSURGICAL) ×1 IMPLANT
GLOVE BIOGEL PI IND STRL 8 (GLOVE) ×1 IMPLANT
GLOVE BIOGEL PI IND STRL 8.5 (GLOVE) ×1 IMPLANT
GLOVE BIOGEL PI INDICATOR 8 (GLOVE) ×1
GLOVE BIOGEL PI INDICATOR 8.5 (GLOVE)
GLOVE ECLIPSE 8.0 STRL XLNG CF (GLOVE) ×4 IMPLANT
GOWN PREVENTION PLUS LG XLONG (DISPOSABLE) ×3 IMPLANT
GOWN STRL REIN XL XLG (GOWN DISPOSABLE) ×4 IMPLANT
IMMOBILIZER KNEE 20 (SOFTGOODS) ×2
IMMOBILIZER KNEE 20 THIGH 36 (SOFTGOODS) ×1 IMPLANT
IMMOBILIZER KNEE 22 UNIV (SOFTGOODS) ×1 IMPLANT
KIT BASIN OR (CUSTOM PROCEDURE TRAY) ×2 IMPLANT
MANIFOLD NEPTUNE II (INSTRUMENTS) ×1 IMPLANT
NDL MA TROC 1/2 CIR (NEEDLE) ×1 IMPLANT
NDL SPNL 18GX3.5 QUINCKE PK (NEEDLE) ×1 IMPLANT
NEEDLE HYPO 22GX1.5 SAFETY (NEEDLE) ×2 IMPLANT
NEEDLE MA TROC 1/2 CIR (NEEDLE) IMPLANT
NEEDLE SPNL 18GX3.5 QUINCKE PK (NEEDLE) IMPLANT
NS IRRIG 1000ML POUR BTL (IV SOLUTION) ×2 IMPLANT
PACK TOTAL JOINT (CUSTOM PROCEDURE TRAY) ×2 IMPLANT
PASSER SUT SWANSON 36MM LOOP (INSTRUMENTS) ×2 IMPLANT
PATCH TISSUE MEND 5X6CM (Orthopedic Implant) ×1 IMPLANT
POSITIONER SURGICAL ARM (MISCELLANEOUS) ×2 IMPLANT
SPONGE GAUZE 4X4 12PLY (GAUZE/BANDAGES/DRESSINGS) ×2 IMPLANT
STAPLER VISISTAT 35W (STAPLE) ×1 IMPLANT
STOCKINETTE 8 INCH (MISCELLANEOUS) ×1 IMPLANT
SUT ETHIBOND 2 OS 4 DA (SUTURE) ×6 IMPLANT
SUT ETHIBOND 5 LR DA (SUTURE) ×2 IMPLANT
SUT FIBERWIRE #2 38 T-5 BLUE (SUTURE)
SUT MNCRL 3 0 RB1 (SUTURE) IMPLANT
SUT MONOCRYL 3 0 RB1 (SUTURE) ×1
SUT VIC AB 0 CT1 27 (SUTURE) ×4
SUT VIC AB 0 CT1 27XBRD ANTBC (SUTURE) IMPLANT
SUT VIC AB 1 CT1 27 (SUTURE) ×8
SUT VIC AB 1 CT1 27XBRD ANTBC (SUTURE) IMPLANT
SUT VIC AB 2-0 CT1 27 (SUTURE) ×4
SUT VIC AB 2-0 CT1 27XBRD (SUTURE) IMPLANT
SUTURE FIBERWR #2 38 T-5 BLUE (SUTURE) IMPLANT
SYR 30ML LL (SYRINGE) ×2 IMPLANT
TOWEL OR 17X26 10 PK STRL BLUE (TOWEL DISPOSABLE) ×4 IMPLANT
WATER STERILE IRR 1500ML POUR (IV SOLUTION) ×3 IMPLANT

## 2011-09-08 NOTE — Interval H&P Note (Signed)
History and Physical Interval Note:  09/08/2011 4:44 PM  [name]  has presented today for surgery, with the diagnosis of left quad tendon rupture  The various methods of treatment have been discussed with the patient and family. After consideration of risks, benefits and other options for treatment, the patient has consented to  Procedure(s) (LRB): REPAIR QUADRICEP TENDON (Left) as Khan surgical intervention .  The patient's history has been reviewed, patient examined, no change in status, stable for surgery.  I have reviewed the patient's chart and labs.  Questions were answered to the patient's satisfaction.     Douglas Khan

## 2011-09-08 NOTE — Anesthesia Preprocedure Evaluation (Addendum)
Anesthesia Evaluation  Patient identified by MRN, date of birth, ID band Patient awake    Reviewed: Allergy & Precautions, H&P , NPO status , Patient's Chart, lab work & pertinent test results  Airway Mallampati: II TM Distance: >3 FB Neck ROM: Full    Dental  (+) Teeth Intact, Chipped and Caps,    Pulmonary sleep apnea and Continuous Positive Airway Pressure Ventilation ,  breath sounds clear to auscultation  Pulmonary exam normal       Cardiovascular hypertension, Pt. on medications Rhythm:Regular Rate:Normal     Neuro/Psych Narcolepsy  Neuromuscular disease negative psych ROS   GI/Hepatic negative GI ROS, Neg liver ROS,   Endo/Other  negative endocrine ROSMorbid obesity  Renal/GU negative Renal ROS  negative genitourinary   Musculoskeletal negative musculoskeletal ROS (+)   Abdominal   Peds negative pediatric ROS (+)  Hematology negative hematology ROS (+)   Anesthesia Other Findings   Reproductive/Obstetrics negative OB ROS                          Anesthesia Physical Anesthesia Plan  ASA: III  Anesthesia Plan: General   Post-op Pain Management:    Induction: Intravenous  Airway Management Planned: Oral ETT  Additional Equipment:   Intra-op Plan:   Post-operative Plan: Extubation in OR  Informed Consent: I have reviewed the patients History and Physical, chart, labs and discussed the procedure including the risks, benefits and alternatives for the proposed anesthesia with the patient or authorized representative who has indicated his/her understanding and acceptance.   Dental advisory given  Plan Discussed with: CRNA  Anesthesia Plan Comments:         Anesthesia Quick Evaluation

## 2011-09-08 NOTE — Anesthesia Procedure Notes (Signed)
Procedure Name: Intubation Date/Time: 09/08/2011 4:56 PM Performed by: Jantzen Libman L Patient Re-evaluated:Patient Re-evaluated prior to inductionOxygen Delivery Method: Circle system utilized Preoxygenation: Pre-oxygenation with 100% oxygen Intubation Type: IV induction Ventilation: Mask ventilation without difficulty and Oral airway inserted - appropriate to patient size Laryngoscope Size: Miller and 3 Grade View: Grade I Tube size: 8.0 mm Number of attempts: 1 Airway Equipment and Method: Stylet Placement Confirmation: ETT inserted through vocal cords under direct vision,  breath sounds checked- equal and bilateral and positive ETCO2 Secured at: 23 cm Tube secured with: Tape Dental Injury: Teeth and Oropharynx as per pre-operative assessment

## 2011-09-08 NOTE — Preoperative (Signed)
Beta Blockers   Reason not to administer Beta Blockers:Not Applicable 

## 2011-09-08 NOTE — Transfer of Care (Signed)
Immediate Anesthesia Transfer of Care Note  Patient: Douglas Khan  Procedure(s) Performed: Procedure(s) (LRB): REPAIR QUADRICEP TENDON (Left)  Patient Location: PACU  Anesthesia Type: General  Level of Consciousness: awake, alert  and oriented  Airway & Oxygen Therapy: Patient Spontanous Breathing and Patient connected to face mask oxygen  Post-op Assessment: Report given to PACU RN and Post -op Vital signs reviewed and stable  Post vital signs: Reviewed and stable  Complications: No apparent anesthesia complications

## 2011-09-09 ENCOUNTER — Encounter (HOSPITAL_COMMUNITY): Payer: Self-pay | Admitting: Orthopedic Surgery

## 2011-09-09 MED ORDER — METHOCARBAMOL 500 MG PO TABS: 500.0000 mg | ORAL_TABLET | Freq: Four times a day (QID) | ORAL | Status: AC | PRN

## 2011-09-09 MED ORDER — OXYCODONE-ACETAMINOPHEN 5-325 MG PO TABS: 1.0000 | ORAL_TABLET | ORAL | Status: AC | PRN

## 2011-09-09 MED ORDER — RIVAROXABAN 10 MG PO TABS: 10.0000 mg | ORAL_TABLET | Freq: Every day | ORAL | Status: DC

## 2011-09-09 NOTE — Progress Notes (Signed)
Utilization review completed.  

## 2011-09-09 NOTE — Evaluation (Deleted)
Physical Therapy Evaluation Patient Details Name: Douglas Khan MRN: [id number] DOB: Jul 27, 1955 Today's Date: 09/09/2011 Time: 4098-1191 PT Time Calculation (min): 31 min  PT Assessment / Plan / Recommendation Clinical Impression  s/p left quad tendon repair; will benefit  from PT to maximize independence for home    PT Assessment  Patient needs continued PT services    Follow Up Recommendations       Barriers to Discharge        Equipment Recommendations  None recommended by PT    Recommendations for Other Services     Frequency Min 6X/week    Precautions / Restrictions Precautions Precautions: Other (comment) Precaution Comments: No ROM right knee Required Braces or Orthoses: Knee Immobilizer - Left Knee Immobilizer - Left: On at all times Restrictions Weight Bearing Restrictions: No LLE Weight Bearing: Weight bearing as tolerated   Pertinent Vitals/Pain       Mobility  Bed Mobility Bed Mobility: Sit to Supine Sit to Supine: 4: Min assist Details for Bed Mobility Assistance: cues for technique Transfers Transfers: Sit to Stand;Stand to Sit Sit to Stand: 4: Min guard Stand to Sit: 4: Min guard;4: Min assist Details for Transfer Assistance: cues for LLE position Ambulation/Gait Ambulation/Gait Assistance: 4: Min guard Ambulation Distance (Feet): 250 Feet Assistive device: Rolling walker Ambulation/Gait Assistance Details: cues for sequence Gait Pattern: Step-through pattern    Exercises     PT Diagnosis: Difficulty walking  PT Problem List: Decreased strength;Decreased activity tolerance;Decreased balance;Decreased mobility;Decreased knowledge of precautions;Decreased knowledge of use of DME PT Treatment Interventions: DME instruction;Gait training;Stair training;Functional mobility training;Therapeutic activities;Patient/family education   PT Goals Acute Rehab PT Goals PT Goal Formulation: With patient Time For Goal Achievement: 09/12/11 Potential to  Achieve Goals: Good  Visit Information  Last PT Received On: 09/09/11 Assistance Needed: +1    Subjective Data  Subjective: it is chronic pain Patient Stated Goal: home tomorrow   Prior Functioning  Home Living Lives With: Spouse Available Help at Discharge: Available PRN/intermittently Type of Home: House Home Access: Stairs to enter [employer] of Steps: 2 Entrance Stairs-Rails: None Home Layout: One level;Able to live on main level with bedroom/bathroom Home Adaptive Equipment: Crutches Prior Function Level of Independence: Independent Communication Communication: No difficulties    Cognition  Overall Cognitive Status: Appears within functional limits for tasks assessed/performed Arousal/Alertness: Awake/alert Orientation Level: Appears intact for tasks assessed Behavior During Session: [facility] for tasks performed    Extremity/Trunk Assessment Right Upper Extremity Assessment RUE ROM/Strength/Tone: [facility] for tasks assessed Left Upper Extremity Assessment LUE ROM/Strength/Tone: [facility] for tasks assessed Right Lower Extremity Assessment RLE ROM/Strength/Tone: [facility] for tasks assessed Left Lower Extremity Assessment LLE ROM/Strength/Tone: Unable to fully assess;Due to precautions   Balance    End of Session PT - End of Session Equipment Utilized During Treatment: Left knee immobilizer Activity Tolerance: Patient tolerated treatment well Patient left: in chair;with call bell/phone within reach;with family/visitor present Nurse Communication: Mobility status  GP     [facility] 09/09/2011, 10:32 AM

## 2011-09-09 NOTE — Progress Notes (Signed)
09/09/11 1400  PT Visit Information  Last PT Received On 09/09/11  Assistance Needed +1  PT Time Calculation  PT Start Time 1336  PT Stop Time 1357  PT Time Calculation (min) 21 min  Subjective Data  Subjective sore  Patient Stated Goal home tomorrow  Precautions  Precautions Other (comment)  Precaution Comments No ROM LEFT knee  Required Braces or Orthoses Knee Immobilizer - Left  Knee Immobilizer - Left On at all times  Restrictions  LLE Weight Bearing WBAT  Cognition  Overall Cognitive Status Appears within functional limits for tasks assessed/performed  Arousal/Alertness Awake/alert  Orientation Level Appears intact for tasks assessed  Behavior During Session [facility] for tasks performed  Bed Mobility  Bed Mobility Supine to Sit;Sit to Supine  Supine to Sit 4: Min assist;HOB flat  Sit to Supine 4: Min assist  Details for Bed Mobility Assistance cues for technique (X2)  Transfers  Transfers Sit to Stand;Stand to Sit  Sit to Stand 5: Supervision;From bed;With upper extremity assist  Stand to Sit 5: Supervision;With upper extremity assist;To bed  Details for Transfer Assistance min VCs for hand placement and LLE position.  Ambulation/Gait  Ambulation/Gait Assistance 5: Supervision  Ambulation Distance (Feet) 200 Feet  Assistive device Rolling walker  Ambulation/Gait Assistance Details cues for sequence  Gait Pattern Step-through pattern  Total Joint Exercises  Ankle Circles/Pumps (pt doing independently)  PT - End of Session  Equipment Utilized During Treatment Left knee immobilizer  Activity Tolerance Patient tolerated treatment well  Patient left in bed;with call bell/phone within reach;with family/visitor present  Nurse Communication Mobility status  PT - Assessment/Plan  Comments on Treatment Session pt progressing well  PT Plan Discharge plan remains appropriate;Frequency remains appropriate  PT Frequency Min 6X/week  Follow Up Recommendations No PT follow up    Equipment Recommended Toilet riser;Other (comment)  Acute Rehab PT Goals  Time For Goal Achievement 09/12/11  Potential to Achieve Goals Good  PT General Charges  $$ ACUTE PT VISIT 1 Procedure  PT Treatments  $Gait Training 8-22 mins

## 2011-09-09 NOTE — Op Note (Signed)
NAMEQUINTERIOUS, WALRAVEN NO.:  [id number]  MEDICAL RECORD NO.:  [id number]  LOCATION:  1614                         FACILITY:  [facility]  PHYSICIAN:  Georges Lynch. Ha Shannahan, M.D.DATE OF BIRTH:  [birth year]  DATE OF PROCEDURE:  09/08/2011 DATE OF DISCHARGE:                              OPERATIVE REPORT   SURGEON:  Georges Lynch. Ricky Gallery, MD  ASSISTANT:  Dimitri Ped, PA  PREOPERATIVE DIAGNOSIS:  Complete rupture of the quadriceps tendon from the left patella.  POSTOPERATIVE DIAGNOSIS:  Complete rupture of the quadriceps tendon from the left patella.  OPERATION: 1. Open repair of a complete rupture of the patellar quadriceps tendon     through the patella. 2. TissueMend graft, utilized large 5 x 5 TissueMend graft.  PROCEDURE IN DETAIL:  Under general anesthesia, routine orthopedic prep and draping of the left lower extremity was carried out.  Note, prior to surgery, I marked the appropriate left leg in the holding area.  The tourniquet was applied.  The leg was exsanguinated with an Esmarch.  The tourniquet was elevated at 325 mmHg.  At this time, the appropriate time- out was carried out prior to doing any surgery as I mentioned, we also marked the appropriate left leg in the holding area.  Following that, I then made an anterior approach of the left knee.  Bleeders identified and cauterized.  Immediately upon going down to the subcu tissue, there was a large hematoma that we evacuated.  We inspected the patella on the joint region and there was a complete rupture of the medulla of the patellar tendon, there was a small portion in the midline that was still intact.  We thoroughly irrigated out the area, and at this particular time, two drill holes were made in the patella.  We then utilized #5 Ethibond suture and repaired the tendon to the patella by way of 2 drill holes and tied sutures distally.  Following that, I then repaired the medial and lateral aspects of the  quadriceps tendon tear.  I then applied a TissueMend graft over the repair site in the usual fashion.  We then irrigated the wound out again.  I loosely applied some thrombin-soaked Gelfoam and closed the wound in the usual fashion.  We closed the subcu with Monocryl suture.  Sterile Neosporin dressing applied.  The patient was placed in a knee immobilizer.  He had 3 g of IV Ancef preop.          ______________________________ Georges Lynch. Darrelyn Hillock, M.D.     RAG/MEDQ  D:  09/08/2011  T:  09/09/2011  Job:  [id number]  cc:   Barbaraann Share, MD,FCCP [address] [place] [address]

## 2011-09-09 NOTE — Evaluation (Signed)
Physical Therapy Evaluation Patient Details Name: Douglas Khan MRN: [id number] DOB: 05-16-1955 Today's Date: 09/09/2011 Time: 4098-1191 PT Time Calculation (min): 31 min  PT Assessment / Plan / Recommendation Clinical Impression  s/p left quad tendon repair; will benefit  from PT to maximize independence for home    PT Assessment  Patient needs continued PT services    Follow Up Recommendations    OP PT per MD after f/u visit at office   Barriers to Discharge        Equipment Recommendations  None recommended by PT    Recommendations for Other Services     Frequency Min 6X/week    Precautions / Restrictions Precautions Precautions: Other (comment) Precaution Comments: No ROM right knee Required Braces or Orthoses: Knee Immobilizer - Left Knee Immobilizer - Left: On at all times Restrictions Weight Bearing Restrictions: No LLE Weight Bearing: Weight bearing as tolerated   Pertinent Vitals/Pain      Mobility  Bed Mobility Bed Mobility: Sit to Supine Sit to Supine: 4: Min assist Details for Bed Mobility Assistance: cues for technique Transfers Transfers: Sit to Stand;Stand to Sit Sit to Stand: 4: Min guard Stand to Sit: 4: Min guard;4: Min assist Details for Transfer Assistance: cues for LLE position Ambulation/Gait Ambulation/Gait Assistance: 4: Min guard Ambulation Distance (Feet): 250 Feet Assistive device: Rolling walker Ambulation/Gait Assistance Details: cues for sequence Gait Pattern: Step-through pattern    Exercises     PT Diagnosis: Difficulty walking  PT Problem List: Decreased strength;Decreased activity tolerance;Decreased balance;Decreased mobility;Decreased knowledge of precautions;Decreased knowledge of use of DME PT Treatment Interventions: DME instruction;Gait training;Stair training;Functional mobility training;Therapeutic activities;Patient/family education   PT Goals Acute Rehab PT Goals PT Goal Formulation: With patient Time For Goal  Achievement: 09/12/11 Potential to Achieve Goals: Good  Visit Information  Last PT Received On: 09/09/11 Assistance Needed: +1    Subjective Data  Subjective: it is chronic pain Patient Stated Goal: home tomorrow   Prior Functioning  Home Living Lives With: Spouse Available Help at Discharge: Available PRN/intermittently Type of Home: House Home Access: Stairs to enter [employer] of Steps: 2 Entrance Stairs-Rails: None Home Layout: One level;Able to live on main level with bedroom/bathroom Home Adaptive Equipment: Crutches Prior Function Level of Independence: Independent Communication Communication: No difficulties    Cognition  Overall Cognitive Status: Appears within functional limits for tasks assessed/performed Arousal/Alertness: Awake/alert Orientation Level: Appears intact for tasks assessed Behavior During Session: [facility] for tasks performed    Extremity/Trunk Assessment Right Upper Extremity Assessment RUE ROM/Strength/Tone: [facility] for tasks assessed Left Upper Extremity Assessment LUE ROM/Strength/Tone: [facility] for tasks assessed Right Lower Extremity Assessment RLE ROM/Strength/Tone: [facility] for tasks assessed Left Lower Extremity Assessment LLE ROM/Strength/Tone: Unable to fully assess;Due to precautions   Balance    End of Session PT - End of Session Equipment Utilized During Treatment: Left knee immobilizer Activity Tolerance: Patient tolerated treatment well Patient left: in chair;with call bell/phone within reach;with family/visitor present Nurse Communication: Mobility status  GP     [facility] 09/09/2011, 10:33 AM

## 2011-09-09 NOTE — Evaluation (Signed)
Occupational Therapy Evaluation Patient Details Name: Douglas Khan MRN: [id number] DOB: 1955-07-21 Today's Date: 09/09/2011 Time: 4098-1191 OT Time Calculation (min): 12 min  OT Assessment / Plan / Recommendation Clinical Impression  Pt doing very well POD 1 quad tenon repair L. All education completed. Pt will have necessary level of A upon d/c.    OT Assessment  Patient does not need any further OT services    Follow Up Recommendations  No OT follow up    Barriers to Discharge      Equipment Recommendations  Toilet riser;Other (comment) (Pt currently refusing.)    Recommendations for Other Services    Frequency       Precautions / Restrictions Precautions Precautions: Other (comment) Precaution Comments: No ROM R knee Required Braces or Orthoses: Knee Immobilizer - Left Knee Immobilizer - Left: On at all times Restrictions Weight Bearing Restrictions: No LLE Weight Bearing: Weight bearing as tolerated   Pertinent Vitals/Pain     ADL  Grooming: Simulated;Supervision/safety Where Assessed - Grooming: Supported standing Toilet Transfer: Research scientist (life sciences) Method: Sit to Barista: Regular height toilet Toileting - Architect and Hygiene: Simulated;Supervision/safety Where Assessed - Engineer, mining and Hygiene: Sit to stand from 3-in-1 or toilet Tub/Shower Transfer: Performed;Supervision/safety Tub/Shower Transfer Method: Science writer: Walk in Scientist, research (physical sciences) Used: Rolling walker ADL Comments: Pt was fully dressed upon OTs arrival and states that he had no difficulties. Pt would likely benefit from a toilet riser or 3n1 over the toilet but is currently refusing stating he can manage at home by grabbing a hold of the door frame. Provided info on where to obtain risers in case pt and wife are interested later.    OT Diagnosis:    OT Problem List:   OT Treatment  Interventions:     OT Goals    Visit Information  Last OT Received On: 09/09/11 Assistance Needed: +1    Subjective Data  Subjective: I don't need anything over my toilet. Patient Stated Goal: Not asked   Prior Functioning  Vision/Perception  Home Living Lives With: Spouse Available Help at Discharge: Available PRN/intermittently Type of Home: House Home Access: Stairs to enter Secretary/administrator of Steps: 2 Entrance Stairs-Rails: None Home Layout: Able to live on main level with bedroom/bathroom;Multi-level Bathroom Shower/Tub: Health visitor: Standard Home Adaptive Equipment: Crutches Prior Function Level of Independence: Independent Able to Take Stairs?: Yes Driving: Yes Vocation: Full time employment Communication Communication: No difficulties Dominant Hand: Right      Cognition  Overall Cognitive Status: Appears within functional limits for tasks assessed/performed Arousal/Alertness: Awake/alert Orientation Level: Appears intact for tasks assessed Behavior During Session: [facility] for tasks performed    Extremity/Trunk Assessment Right Upper Extremity Assessment RUE ROM/Strength/Tone: [facility] for tasks assessed Left Upper Extremity Assessment LUE ROM/Strength/Tone: WFL for tasks assessed   Mobility Bed Mobility Bed Mobility: Sit to Supine Sit to Supine: 4: Min assist Details for Bed Mobility Assistance: cues for technique Transfers Sit to Stand: 5: Supervision;With upper extremity assist;From chair/3-in-1;From toilet;With armrests Stand to Sit: 5: Supervision;With upper extremity assist;With armrests;To toilet Details for Transfer Assistance: min VCs for hand placement and LLE position.   Exercise    Balance    End of Session OT - End of Session Activity Tolerance: Patient tolerated treatment well Patient left: in chair;with call bell/phone within reach;with family/visitor present  GO     Douglas Khan A OTR/L [phone] 09/09/2011,  11:55 AM

## 2011-09-09 NOTE — Progress Notes (Signed)
Subjective: Doing very well this A.M. Will need PT today.   Objective: Vital signs in last 24 hours: Temp:  [97.4 F (36.3 C)-98.6 F (37 C)] 97.6 F (36.4 C) (08/09 0631) Pulse Rate:  [67-100] 67  (08/09 0631) Resp:  [12-20] 16  (08/09 0631) BP: (130-167)/(72-118) 135/77 mmHg (08/09 0631) SpO2:  [94 %-100 %] 97 % (08/09 0631) Weight:  [131.231 kg (289 lb 5 oz)] 131.231 kg (289 lb 5 oz) (08/08 1350)  Intake/Output from previous day: 08/08 0701 - 08/09 0700 In: 2300 [I.V.:2300] Out: 2500 [Urine:2450; Blood:50] Intake/Output this shift: Total I/O In: -  Out: 600 [Urine:600]   Basename 09/08/11 1420  HGB 15.9    Basename 09/08/11 1420  WBC 8.3  RBC 5.06  HCT 43.7  PLT 204    Basename 09/08/11 1420  NA 136  K 3.5  CL 102  CO2 22  BUN 22  CREATININE 1.09  GLUCOSE 102*  CALCIUM 9.1    Basename 09/08/11 1420  LABPT --  INR 1.07    Neurologically intact Dorsiflexion/Plantar flexion intact  Assessment/Plan: Plan on DC tomorrow.   Hameed Kolar A 09/09/2011, 7:26 AM

## 2011-09-09 NOTE — Care Management Note (Unsigned)
    Page 1 of 1   09/09/2011     5:23:45 PM   CARE MANAGEMENT NOTE 09/09/2011  Patient:  ANIKIN, PROSSER   Account Number:  [id number]  Date Initiated:  09/09/2011  Documentation initiated by:  Colleen Can  Subjective/Objective Assessment:   dx left quad tendon rupture; quad tendon repair     Action/Plan:   CM spoke with patient ad spouse. Plans are for him to return to his home in [place] where spouse will be caregiver. States he already has RW and does not need 3n1   Anticipated DC Date:  09/10/2011   Anticipated DC Plan:  HOME/SELF CARE  In-house referral  NA      DC Planning Services  CM consult      Choice offered to / List presented to:     DME arranged  NA      DME agency  NA        Status of service:  In process, will continue to follow Medicare Important Message given?   (If response is "NO", the following Medicare IM given date fields will be blank) Date Medicare IM given:   Date Additional Medicare IM given:    Discharge Disposition:    Per UR Regulation:    If discussed at Long Length of Stay Meetings, dates discussed:    Comments:  09/09/2011 Raynelle Bring BSN CCM [phone] There are currently no orders for [facility] services. CM will follw

## 2011-09-10 NOTE — Progress Notes (Signed)
Physical Therapy Treatment Patient Details Name: Douglas Khan MRN: [id number] DOB: [birth year] Today's Date: 09/10/2011 Time: 2956-2130 PT Time Calculation (min): 29 min  PT Assessment / Plan / Recommendation Comments on Treatment Session  pt progressing well    Follow Up Recommendations  No PT follow up    Barriers to Discharge        Equipment Recommendations  None recommended by PT    Recommendations for Other Services    Frequency     Plan Discharge plan remains appropriate;Frequency remains appropriate    Precautions / Restrictions Precautions Precaution Comments: No ROM LEFT knee Required Braces or Orthoses: Knee Immobilizer - Left Knee Immobilizer - Left: On at all times Restrictions Weight Bearing Restrictions: No LLE Weight Bearing: Weight bearing as tolerated   Pertinent Vitals/Pain     Mobility  Bed Mobility Bed Mobility: Supine to Sit Supine to Sit: 4: Min assist;HOB flat Details for Bed Mobility Assistance: cues for technique and min assist with LLE Transfers Transfers: Sit to Stand;Stand to Sit Sit to Stand: 6: Modified independent (Device/Increase time);From bed;With upper extremity assist Stand to Sit: 6: Modified independent (Device/Increase time);To chair/3-in-1;With upper extremity assist Ambulation/Gait Ambulation/Gait Assistance: 5: Supervision;6: Modified independent (Device/Increase time) Ambulation Distance (Feet): 350 Feet Assistive device: Crutches Ambulation/Gait Assistance Details: intial cue sfor sequence Gait Pattern: Step-to pattern;Step-through pattern Stairs: Yes Stairs Assistance: 4: Min guard Stair Management Technique: With crutches;Forwards;Step to pattern Number of Stairs: 4     Exercises Total Joint Exercises Ankle Circles/Pumps: Other (comment) (pt doing independently)   PT Diagnosis:    PT Problem List:   PT Treatment Interventions:     PT Goals Acute Rehab PT Goals Time For Goal Achievement: 09/12/11 Potential to  Achieve Goals: Good Pt will go Supine/Side to Sit: with min assist;Other (comment) (with correct technique) PT Goal: Supine/Side to Sit - Progress: Met Pt will go Sit to Stand: with modified independence PT Goal: Sit to Stand - Progress: Met Pt will Ambulate: >150 feet;with supervision;with modified independence;with least restrictive assistive device PT Goal: Ambulate - Progress: Met Pt will Go Up / Down Stairs: 3-5 stairs;with min assist;with crutches PT Goal: Up/Down Stairs - Progress: Met  Visit Information  Last PT Received On: 09/10/11 Assistance Needed: +1    Subjective Data  Subjective: almost no pain   Cognition  Overall Cognitive Status: Appears within functional limits for tasks assessed/performed Arousal/Alertness: Awake/alert Orientation Level: Appears intact for tasks assessed Behavior During Session: [facility] for tasks performed    Balance     End of Session PT - End of Session Equipment Utilized During Treatment: Left knee immobilizer Activity Tolerance: Patient tolerated treatment well Patient left: in chair;with call bell/phone within reach;with family/visitor present   GP     [facility] 09/10/2011, 10:15 AM

## 2011-09-10 NOTE — Anesthesia Postprocedure Evaluation (Signed)
Anesthesia Post Note  Patient: Douglas Khan  Procedure(s) Performed: Procedure(s) (LRB): REPAIR QUADRICEP TENDON (Left)  Anesthesia type: General  Patient location: PACU  Post pain: Pain level controlled  Post assessment: Post-op Vital signs reviewed  Last Vitals:  Filed Vitals:   09/10/11 1024  BP: 133/80  Pulse: 70  Temp: 36.7 C  Resp: 14    Post vital signs: Reviewed  Level of consciousness: sedated  Complications: No apparent anesthesia complications

## 2011-09-10 NOTE — Progress Notes (Signed)
Subjective: Patient is feeling better today than a little bit sore after physical therapy yesterday no shortness of breath chest pain or discomfort   Objective: Vital signs in last 24 hours: Temp:  [98.2 F (36.8 C)-98.9 F (37.2 C)] 98.2 F (36.8 C) (08/10 0541) Pulse Rate:  [73-93] 73  (08/10 0541) Resp:  [14-18] 14  (08/10 0541) BP: (109-128)/(69-76) 109/69 mmHg (08/10 0541) SpO2:  [95 %-97 %] 96 % (08/10 0541)  Intake/Output from previous day: 08/09 0701 - 08/10 0700 In: 1314.3 [P.O.:360; I.V.:954.3] Out: 3000 [Urine:3000] Intake/Output this shift:     Basename 09/08/11 1420  HGB 15.9    Basename 09/08/11 1420  WBC 8.3  RBC 5.06  HCT 43.7  PLT 204    Basename 09/08/11 1420  NA 136  K 3.5  CL 102  CO2 22  BUN 22  CREATININE 1.09  GLUCOSE 102*  CALCIUM 9.1    Basename 09/08/11 1420  LABPT --  INR 1.07     Patient's conscious alert and appropriate sitting in bed appears to be in no distress his left r dressing was taken down his anterior knee incision is well approximated with Steri-Strips he has no pressure blisters no drainage no signs of infection his thigh and calf are soft nontender his leg is neuromotor vascularly intact  Assessment/Plan: Postop day #2 status post left knee quad tendon repair doing very well  Plan reinforced the knee extension knee brace non-bending of the knee. We'll have work with physical therapy once this morning and discharged to home to followup with Dr. Darrelyn Hillock in 10 days.   Douglas Khan 09/10/2011, 8:42 AM

## 2011-09-10 NOTE — Progress Notes (Signed)
Discharged via w/c to family auto. Assessment unchanged from am. 

## 2011-09-12 NOTE — Discharge Summary (Signed)
Physician Discharge Summary   Patient ID: Douglas Khan MRN: [id number] DOB/AGE: Nov 06, 1955 56 y.o.  Admit date: 09/08/2011 Discharge date: 09/10/2011  Primary Diagnosis:  Quadriceps tendon rupture, left  Admission Diagnoses:  Past Medical History  Diagnosis Date  . Hypertension   . Peripheral neuropathy     BOTH FEET  . Sleep apnea     USES C-PAP  . Narcolepsy    Discharge Diagnoses:   Active Problems:  Quadriceps tendon rupture S/p left quadriceps tendon repair  Procedure:  Procedure(s) (LRB): REPAIR QUADRICEP TENDON (Left)   Consults: None  HPI: The patient is a 56 year old male who presents with left knee pain. He reports that on Saturday September 03, 2011 he was walking down a grassy hill when he slipped and fell on both knees, hyperflexing both of them. He reports that he immediately had pain in the left knee and heard a loud pop. He has since had pain and swelling in the knee, and has been unable to extend it and bear weight on it. He has had some pain in the right knee but it is starting to fell better already. No swelling or instability in the right knee. No previous issues in the knees. He denies groin or lateral hip pain. No back pain. No numbness or tingling in the legs. Since then he has been seen at [facility] where they diagnosed him with a quad tendon tear in the left knee. He was placed in a knee immobilizer and on crutches.     Laboratory Data: Admission on 09/26/2006, Discharged on 09/27/2006  Component Date Value Range Status  . Sodium 09/26/2006 138   Final  . Potassium 09/26/2006 3.7   Final  . Chloride 09/26/2006 104   Final  . CO2 09/26/2006 23   Final  . Glucose, Bld 09/26/2006 100*  Final  . BUN 09/26/2006 22   Final  . Creatinine, Ser 09/26/2006 1.40   Final  . Calcium 09/26/2006 9.5   Final  . GFR calc non Af Amer 09/26/2006 53*  Final  . GFR calc Af Denyse Dago 09/26/2006    Final                   Value:>60                                The  eGFR has been calculated                         using the MDRD equation.                         This calculation has not been                         validated in all clinical  . WBC 09/26/2006 8.2   Final  . RBC 09/26/2006 5.28   Final  . Hemoglobin 09/26/2006 16.7   Final  . HCT 09/26/2006 47.4   Final  . MCV 09/26/2006 89.8   Final  . MCHC 09/26/2006 35.3   Final  . RDW 09/26/2006 12.6   Final  . Platelets 09/26/2006 265   Final  . Color, Urine 09/26/2006 YELLOW   Final  . APPearance 09/26/2006 CLOUDY*  Final  . Specific Gravity, Urine 09/26/2006 1.014   Final  . pH 09/26/2006  6.5   Final  . Glucose, UA 09/26/2006 NEGATIVE   Final  . Hgb urine dipstick 09/26/2006 NEGATIVE   Final  . Bilirubin Urine 09/26/2006 NEGATIVE   Final  . Ketones, ur 09/26/2006 NEGATIVE   Final  . Protein, ur 09/26/2006 NEGATIVE   Final  . Urobilinogen, UA 09/26/2006 0.2   Final  . Nitrite 09/26/2006 NEGATIVE   Final  . Leukocytes, UA 09/26/2006 NEGATIVE MICROSCOPIC NOT DONE ON URINES WITH NEGATIVE PROTEIN, BLOOD, LEUKOCYTES, NITRITE, OR GLUCOSE <1000 mg/dL.   Final     EKG: Orders placed during the hospital encounter of 09/08/11  . EKG 12-LEAD  . EKG 12-LEAD  . EKG 12-LEAD  . EKG     Hospital Course: Patient was admitted to [facility] and taken to the OR and underwent the above state procedure without complications.  Patient tolerated the procedure well and was later transferred to the recovery room and then to the orthopaedic floor for postoperative care.  They were given PO and IV analgesics for pain control following their surgery.  They were given 24 hours of postoperative antibiotics and started on DVT prophylaxis in the form of Xarelto.   PT and OT were ordered.Marland Kitchen  Discharge planning consulted to help with postop disposition and equipment needs.  Patient had a rough night on the evening of surgery due to pain but started to feel better the next morning. The patient started to get up OOB  with therapy on day one.   Continued to work with therapy into day two.  Dressing was changed on day two and the incision was clean and dry. Patient was seen in rounds and was ready to go home.  Discharge Medications: Prior to Admission medications   Medication Sig Start Date End Date Taking? Authorizing Provider  amphetamine-dextroamphetamine (ADDERALL XR) 15 MG 24 hr capsule Take 15 mg by mouth every morning. One or two daily   Yes Historical Provider, MD  guaiFENesin (MUCINEX) 600 MG 12 hr tablet Take 400 mg by mouth 2 (two) times daily.   Yes Historical Provider, MD  losartan (COZAAR) 25 MG tablet Take 25 mg by mouth every evening.   Yes Historical Provider, MD  losartan-hydrochlorothiazide (HYZAAR) 50-12.5 MG per tablet Take 1 tablet by mouth every morning.   Yes Historical Provider, MD  mometasone (NASONEX) 50 MCG/ACT nasal spray Place 2 sprays into the nose daily.   Yes Historical Provider, MD  oxymetazoline (AFRIN) 0.05 % nasal spray Place 2 sprays into the nose at bedtime.   Yes Historical Provider, MD  Tadalafil (CIALIS) 2.5 MG TABS Take 1 tablet by mouth daily.   Yes Historical Provider, MD  testosterone (ANDROGEL) 50 MG/5GM GEL Place 5 g onto the skin daily.   Yes Historical Provider, MD  traMADol (ULTRAM) 50 MG tablet Take 50-100 mg by mouth every 6 (six) hours as needed. For pain   Yes Historical Provider, MD  zaleplon (SONATA) 10 MG capsule Take 10 mg by mouth at bedtime.   Yes Historical Provider, MD  methocarbamol (ROBAXIN) 500 MG tablet Take 1 tablet (500 mg total) by mouth every 6 (six) hours as needed. 09/09/11 09/19/11  Derral Colucci Tamala Ser, PA  oxyCODONE-acetaminophen (PERCOCET/ROXICET) 5-325 MG per tablet Take 1-2 tablets by mouth every 3 (three) hours as needed (Q4-6 hours PRN). 09/09/11 09/19/11  Ivan Lacher Tamala Ser, PA  rivaroxaban (XARELTO) 10 MG TABS tablet Take 1 tablet (10 mg total) by mouth daily with breakfast. 09/09/11   Jannine Abreu Tamala Ser, PA  Diet: Cardiac  diet Activity:WBAT Follow-up:in 10 days Disposition - Home Discharged Condition: good   Discharge Orders    Future Orders Please Complete By Expires   Diet - low sodium heart healthy      Call MD / Call 911      Comments:   If you experience chest pain or shortness of breath, CALL 911 and be transported to the hospital emergency room.  If you develope a fever above 101 F, pus (white drainage) or increased drainage or redness at the wound, or calf pain, call your surgeon's office.   Constipation Prevention      Comments:   Drink plenty of fluids.  Prune juice may be helpful.  You may use a stool softener, such as Colace (over the counter) 100 mg twice a day.  Use MiraLax (over the counter) for constipation as needed.   Increase activity slowly as tolerated      Comments:   MUST wear knee immobilizer on left knee at all times   Discharge instructions      Comments:   Walk with your crutches Weight bearing as instructed. Change your dressing daily. Shower only, no tub bath. Call if any temperatures greater than 101 or any wound complications: [phone] during the day and ask for Dr. Jeannetta Ellis nurse, Mac[facility]. You may resume taking vitamins, supplements, and aspirin after completing three week course of Xarelto   Weight bearing as tolerated      Driving restrictions      Comments:   No driving     Medication List  As of 09/12/2011  7:14 AM   STOP taking these medications         aspirin EC 81 MG tablet      glucosamine-chondroitin 500-400 MG tablet      pyridOXINE 100 MG tablet      SAW PALMETTO PO      vitamin C 500 MG tablet         TAKE these medications         amphetamine-dextroamphetamine 15 MG 24 hr capsule   Commonly known as: ADDERALL XR   Take 15 mg by mouth every morning. One or two daily      CIALIS 2.5 MG Tabs   Generic drug: Tadalafil   Take 1 tablet by mouth daily.      guaiFENesin 600 MG 12 hr tablet   Commonly known as: MUCINEX   Take 400 mg  by mouth 2 (two) times daily.      losartan 25 MG tablet   Commonly known as: COZAAR   Take 25 mg by mouth every evening.      losartan-hydrochlorothiazide 50-12.5 MG per tablet   Commonly known as: HYZAAR   Take 1 tablet by mouth every morning.      methocarbamol 500 MG tablet   Commonly known as: ROBAXIN   Take 1 tablet (500 mg total) by mouth every 6 (six) hours as needed.      mometasone 50 MCG/ACT nasal spray   Commonly known as: NASONEX   Place 2 sprays into the nose daily.      oxyCODONE-acetaminophen 5-325 MG per tablet   Commonly known as: PERCOCET/ROXICET   Take 1-2 tablets by mouth every 3 (three) hours as needed (Q4-6 hours PRN).      oxymetazoline 0.05 % nasal spray   Commonly known as: AFRIN   Place 2 sprays into the nose at bedtime.      rivaroxaban 10 MG Tabs  tablet   Commonly known as: XARELTO   Take 1 tablet (10 mg total) by mouth daily with breakfast.      testosterone 50 MG/5GM Gel   Commonly known as: ANDROGEL   Place 5 g onto the skin daily.      traMADol 50 MG tablet   Commonly known as: ULTRAM   Take 50-100 mg by mouth every 6 (six) hours as needed. For pain      zaleplon 10 MG capsule   Commonly known as: SONATA   Take 10 mg by mouth at bedtime.             Signed: Lanee Chain LAUREN 09/12/2011, 7:14 AM

## 2011-12-02 DIAGNOSIS — G629 Polyneuropathy, unspecified: Secondary | ICD-10-CM | POA: Insufficient documentation

## 2013-10-08 ENCOUNTER — Other Ambulatory Visit: Payer: Self-pay | Admitting: Neurology

## 2013-10-08 DIAGNOSIS — G609 Hereditary and idiopathic neuropathy, unspecified: Secondary | ICD-10-CM

## 2013-10-17 ENCOUNTER — Other Ambulatory Visit: Payer: BC Managed Care – PPO

## 2013-10-21 ENCOUNTER — Ambulatory Visit
Admission: RE | Admit: 2013-10-21 | Discharge: 2013-10-21 | Disposition: A | Discharge status: Home / Self Care | Payer: BC Managed Care – PPO | Source: Ambulatory Visit | Attending: Neurology | Admitting: Neurology

## 2013-10-21 DIAGNOSIS — G609 Hereditary and idiopathic neuropathy, unspecified: Secondary | ICD-10-CM

## 2013-12-23 DIAGNOSIS — F5104 Psychophysiologic insomnia: Secondary | ICD-10-CM | POA: Insufficient documentation

## 2014-06-24 DIAGNOSIS — F988 Other specified behavioral and emotional disorders with onset usually occurring in childhood and adolescence: Secondary | ICD-10-CM | POA: Insufficient documentation

## 2015-03-31 ENCOUNTER — Encounter: Payer: Self-pay | Admitting: Gastroenterology

## 2015-06-23 ENCOUNTER — Encounter (HOSPITAL_COMMUNITY): Payer: Self-pay

## 2015-06-23 ENCOUNTER — Emergency Department (HOSPITAL_COMMUNITY)
Admission: EM | Admit: 2015-06-23 | Discharge: 2015-06-23 | Disposition: A | Discharge status: Home / Self Care | Payer: BLUE CROSS/BLUE SHIELD | Attending: Emergency Medicine | Admitting: Emergency Medicine

## 2015-06-23 ENCOUNTER — Emergency Department (HOSPITAL_COMMUNITY): Payer: BLUE CROSS/BLUE SHIELD

## 2015-06-23 DIAGNOSIS — G4730 Sleep apnea, unspecified: Secondary | ICD-10-CM | POA: Diagnosis not present

## 2015-06-23 DIAGNOSIS — I10 Essential (primary) hypertension: Secondary | ICD-10-CM | POA: Insufficient documentation

## 2015-06-23 DIAGNOSIS — Z7901 Long term (current) use of anticoagulants: Secondary | ICD-10-CM | POA: Diagnosis not present

## 2015-06-23 DIAGNOSIS — R002 Palpitations: Secondary | ICD-10-CM | POA: Diagnosis present

## 2015-06-23 DIAGNOSIS — R0789 Other chest pain: Secondary | ICD-10-CM | POA: Diagnosis not present

## 2015-06-23 DIAGNOSIS — Z79899 Other long term (current) drug therapy: Secondary | ICD-10-CM | POA: Diagnosis not present

## 2015-06-23 DIAGNOSIS — Z7951 Long term (current) use of inhaled steroids: Secondary | ICD-10-CM | POA: Insufficient documentation

## 2015-06-23 LAB — BASIC METABOLIC PANEL
Anion gap: 9 (ref 5–15)
BUN: 14 mg/dL (ref 6–20)
CHLORIDE: 109 mmol/L (ref 101–111)
CO2: 21 mmol/L — AB (ref 22–32)
Calcium: 9.5 mg/dL (ref 8.9–10.3)
Creatinine, Ser: 1.27 mg/dL — ABNORMAL HIGH (ref 0.61–1.24)
GFR calc Af Amer: >60 mL/min (ref >60)
GFR calc non Af Amer: 59 mL/min — ABNORMAL LOW (ref >60)
Glucose, Bld: 107 mg/dL — ABNORMAL HIGH (ref 65–99)
POTASSIUM: 4.2 mmol/L (ref 3.5–5.1)
SODIUM: 139 mmol/L (ref 135–145)

## 2015-06-23 LAB — CBC
HEMATOCRIT: 51.7 % (ref 39.0–52.0)
HEMOGLOBIN: 17.4 g/dL — AB (ref 13.0–17.0)
MCH: 30.3 pg (ref 26.0–34.0)
MCHC: 33.7 g/dL (ref 30.0–36.0)
MCV: 89.9 fL (ref 78.0–100.0)
Platelets: 204 10*3/uL (ref 150–400)
RBC: 5.75 MIL/uL (ref 4.22–5.81)
RDW: 13.1 % (ref 11.5–15.5)
WBC: 8.8 10*3/uL (ref 4.0–10.5)

## 2015-06-23 LAB — I-STAT TROPONIN, ED: Troponin i, poc: 0 ng/mL (ref 0.00–0.08)

## 2015-06-23 MED ORDER — SODIUM CHLORIDE 0.9 % IV BOLUS (SEPSIS)
1000.0000 mL | Freq: Once | INTRAVENOUS | Status: AC
  Administered 2015-06-23: 1000 mL via INTRAVENOUS

## 2015-06-23 NOTE — Discharge Instructions (Signed)

## 2015-06-23 NOTE — ED Provider Notes (Signed)
CSN: [id number]     Arrival date & time 06/23/15  1001 History   First MD Initiated Contact with Patient 06/23/15 1032     Chief Complaint  Patient presents with  . Palpitations  . Chest Pain     (Consider location/radiation/quality/duration/timing/severity/associated sxs/prior Treatment) HPI Comments: Patient presents to emergency department with chief complaint of chest tightness and palpitations 24 hours. He states that he has a history of heart palpitations, states he normally gets these when he is stressed or tired. He states that he became more concerned today after he was having symptoms for the past 24 hours consistently. He states that he has felt lightheaded, and has had a slight headache. Its that he has some associated chest tightness, but denies pain specifically. He denies any associated shortness of breath, nausea, vomiting, diarrhea. Denies any radiating pain or symptoms to his arm or to his jaw. There are no other associated symptoms. There are no modifying factors.  The history is provided by the patient. No language interpreter was used.    Past Medical History  Diagnosis Date  . Hypertension   . Peripheral neuropathy (HCC)     BOTH FEET  . Sleep apnea     USES C-PAP  . Narcolepsy    Past Surgical History  Procedure Laterality Date  . Tarsal tunnel release      both feet  . Tonsillectomy    . Uvulectomy    . Nasal septum surgery    . Biceps tendon repair  RT  . Dental implant    . Vein ligation and stripping      LEGS  . Quadriceps tendon repair  09/08/2011    Procedure: REPAIR QUADRICEP TENDON;  Surgeon: Jacki Cones, MD;  Location: [facility] ORS;  Service: Orthopedics;  Laterality: Left;  withtissue mend graft   No family history on file. Social History  Substance Use Topics  . Smoking status: Never Smoker   . Smokeless tobacco: None  . Alcohol Use: Yes     Comment: OCCASIONAL    Review of Systems  Constitutional: Negative for fever and chills.   Respiratory: Positive for chest tightness. Negative for shortness of breath.   Cardiovascular: Positive for palpitations. Negative for chest pain.  Gastrointestinal: Negative for nausea, vomiting, diarrhea and constipation.  Genitourinary: Negative for dysuria.  All other systems reviewed and are negative.     Allergies  Review of patient's allergies indicates no known allergies.  Home Medications   Prior to Admission medications   Medication Sig Start Date End Date Taking? Authorizing Provider  amphetamine-dextroamphetamine (ADDERALL XR) 15 MG 24 hr capsule Take 15 mg by mouth every morning. One or two daily    Historical Provider, MD  guaiFENesin (MUCINEX) 600 MG 12 hr tablet Take 400 mg by mouth 2 (two) times daily.    Historical Provider, MD  losartan (COZAAR) 25 MG tablet Take 25 mg by mouth every evening.    Historical Provider, MD  losartan-hydrochlorothiazide (HYZAAR) 50-12.5 MG per tablet Take 1 tablet by mouth every morning.    Historical Provider, MD  mometasone (NASONEX) 50 MCG/ACT nasal spray Place 2 sprays into the nose daily.    Historical Provider, MD  oxymetazoline (AFRIN) 0.05 % nasal spray Place 2 sprays into the nose at bedtime.    Historical Provider, MD  rivaroxaban (XARELTO) 10 MG TABS tablet Take 1 tablet (10 mg total) by mouth daily with breakfast. 09/09/11   Dimitri Ped, PA-C  Tadalafil (CIALIS) 2.5 MG TABS Take  1 tablet by mouth daily.    Historical Provider, MD  testosterone (ANDROGEL) 50 MG/5GM GEL Place 5 g onto the skin daily.    Historical Provider, MD  traMADol (ULTRAM) 50 MG tablet Take 50-100 mg by mouth every 6 (six) hours as needed. For pain    Historical Provider, MD  zaleplon (SONATA) 10 MG capsule Take 10 mg by mouth at bedtime.    Historical Provider, MD   BP 132/79 mmHg  Pulse 72  Temp(Src) 97.9 F (36.6 C)  Resp 18  Ht 6' (1.829 m)  Wt 129.275 kg  BMI 38.64 kg/m2  SpO2 97% Physical Exam  Constitutional: He is oriented to person,  place, and time. He appears well-developed and well-nourished.  HENT:  Head: Normocephalic and atraumatic.  Eyes: Conjunctivae and EOM are normal. Pupils are equal, round, and reactive to light. Right eye exhibits no discharge. Left eye exhibits no discharge. No scleral icterus.  Neck: Normal range of motion. Neck supple. No JVD present.  Cardiovascular: Normal rate, regular rhythm and normal heart sounds.  Exam reveals no gallop and no friction rub.   No murmur heard. Pulmonary/Chest: Effort normal and breath sounds normal. No respiratory distress. He has no wheezes. He has no rales. He exhibits no tenderness.  Abdominal: Soft. He exhibits no distension and no mass. There is no tenderness. There is no rebound and no guarding.  Musculoskeletal: Normal range of motion. He exhibits no edema or tenderness.  Neurological: He is alert and oriented to person, place, and time.  Skin: Skin is warm and dry.  Psychiatric: He has a normal mood and affect. His behavior is normal. Judgment and thought content normal.  Nursing note and vitals reviewed.   ED Course  Procedures (including critical care time) Results for orders placed or performed during the hospital encounter of 06/23/15  Basic metabolic panel  Result Value Ref Range   Sodium 139 135 - 145 mmol/L   Potassium 4.2 3.5 - 5.1 mmol/L   Chloride 109 101 - 111 mmol/L   CO2 21 (L) 22 - 32 mmol/L   Glucose, Bld 107 (H) 65 - 99 mg/dL   BUN 14 6 - 20 mg/dL   Creatinine, Ser 9.60 (H) 0.61 - 1.24 mg/dL   Calcium 9.5 8.9 - 45.4 mg/dL   GFR calc non Af Amer 59 (L) >60 mL/min   GFR calc Af Amer >60 >60 mL/min   Anion gap 9 5 - 15  CBC  Result Value Ref Range   WBC 8.8 4.0 - 10.5 K/uL   RBC 5.75 4.22 - 5.81 MIL/uL   Hemoglobin 17.4 (H) 13.0 - 17.0 g/dL   HCT 09.8 11.9 - 14.7 %   MCV 89.9 78.0 - 100.0 fL   MCH 30.3 26.0 - 34.0 pg   MCHC 33.7 30.0 - 36.0 g/dL   RDW 82.9 56.2 - 13.0 %   Platelets 204 150 - 400 K/uL  I-stat troponin, ED   Result Value Ref Range   Troponin i, poc 0.00 0.00 - 0.08 ng/mL   Comment 3           Dg Chest 2 View  06/23/2015  CLINICAL DATA:  Chest tightness for the past 24 hours associated with palpitations; history of hypertension, hyperlipidemia and right bundle branch block. EXAM: CHEST  2 VIEW COMPARISON:  Chest x-ray of Jun 10, 2011 FINDINGS: The lungs are reasonably well inflated. There is no focal infiltrate. There is no pleural effusion. The heart is normal in size.  The pulmonary vascularity is not engorged. The mediastinum is normal in width. The bony thorax exhibits no acute abnormality. IMPRESSION: There is no active cardiopulmonary disease. Electronically Signed   By: David  [place] M.D.   On: 06/23/2015 11:23    I have personally reviewed and evaluated these images and lab results as part of my medical decision-making.   EKG Interpretation   Date/Time:  Tuesday Jun 23 2015 10:14:42 EDT Ventricular Rate:  70 PR Interval:  144 QRS Duration: 124 QT Interval:  428 QTC Calculation: 462 R Axis:   -66 Text Interpretation:  Sinus rhythm with occasional and consecutive  Premature ventricular complexes and Fusion complexes Left axis deviation  Right bundle branch block No significant change since last tracing  Confirmed by Anitra Lauth  MD, Alphonzo Lemmings ([id number]) on 06/23/2015 10:51:01 AM      MDM   Final diagnoses:  Palpitations    Patient with palpitations 24 hours. No associated shortness breath. Did have some chest tightness, but this has resolved.  Troponin is negative.  Patient might be a little dehydrated.  I encouraged him to drink more water and to follow-up with cardiology.  Patient understands and agrees with the plan.   Roxy Horseman, PA-C 06/23/15 1346  Gwyneth Sprout, MD 06/23/15 [phone]

## 2015-06-23 NOTE — ED Notes (Signed)
Patient here with 24 hours of chest tightness, palpitations, and headache. Discomfort on arrival

## 2016-02-18 DIAGNOSIS — H52222 Regular astigmatism, left eye: Secondary | ICD-10-CM | POA: Insufficient documentation

## 2016-02-18 DIAGNOSIS — H25812 Combined forms of age-related cataract, left eye: Secondary | ICD-10-CM | POA: Insufficient documentation

## 2016-06-28 ENCOUNTER — Encounter: Payer: Self-pay | Admitting: Gastroenterology

## 2016-12-07 DIAGNOSIS — M5440 Lumbago with sciatica, unspecified side: Secondary | ICD-10-CM

## 2016-12-07 DIAGNOSIS — G8929 Other chronic pain: Secondary | ICD-10-CM | POA: Insufficient documentation

## 2017-01-10 DIAGNOSIS — R7989 Other specified abnormal findings of blood chemistry: Secondary | ICD-10-CM | POA: Insufficient documentation

## 2017-01-17 ENCOUNTER — Other Ambulatory Visit: Payer: Self-pay | Admitting: Neurological Surgery

## 2017-01-17 DIAGNOSIS — M545 Low back pain: Principal | ICD-10-CM

## 2017-01-17 DIAGNOSIS — G8929 Other chronic pain: Secondary | ICD-10-CM

## 2017-02-03 ENCOUNTER — Ambulatory Visit
Admission: RE | Admit: 2017-02-03 | Discharge: 2017-02-03 | Disposition: A | Discharge status: Home / Self Care | Payer: BLUE CROSS/BLUE SHIELD | Source: Ambulatory Visit | Attending: Neurological Surgery | Admitting: Neurological Surgery

## 2017-02-03 DIAGNOSIS — G8929 Other chronic pain: Secondary | ICD-10-CM

## 2017-02-03 DIAGNOSIS — M545 Low back pain: Principal | ICD-10-CM

## 2017-02-03 MED ORDER — DIAZEPAM 5 MG PO TABS: 10.0000 mg | ORAL_TABLET | Freq: Once | ORAL | Status: DC

## 2017-02-03 MED ORDER — IOPAMIDOL (ISOVUE-M 200) INJECTION 41%
15.0000 mL | Freq: Once | INTRAMUSCULAR | Status: AC
  Administered 2017-02-03: 15 mL via INTRATHECAL

## 2017-02-03 NOTE — Progress Notes (Signed)
Patient states he has been off Tramadol for at least the past two days.  J. Grahm Etsitty, RN 

## 2017-02-03 NOTE — Discharge Instructions (Signed)
Myelogram Discharge Instructions  1. Go home and rest quietly for the next 24 hours.  It is important to lie flat for the next 24 hours.  Get up only to go to the restroom.  You may lie in the bed or on a couch on your back, your stomach, your left side or your right side.  You may have one pillow under your head.  You may have pillows between your knees while you are on your side or under your knees while you are on your back.  2. DO NOT drive today.  Recline the seat as far back as it will go, while still wearing your seat belt, on the way home.  3. You may get up to go to the bathroom as needed.  You may sit up for 10 minutes to eat.  You may resume your normal diet and medications unless otherwise indicated.  Drink lots of extra fluids today and tomorrow.  4. The incidence of headache, nausea, or vomiting is about 5% (one in 20 patients).  If you develop a headache, lie flat and drink plenty of fluids until the headache goes away.  Caffeinated beverages may be helpful.  If you develop severe nausea and vomiting or a headache that does not go away with flat bed rest, call [phone].  5. You may resume normal activities after your 24 hours of bed rest is over; however, do not exert yourself strongly or do any heavy lifting tomorrow. If when you get up you have a headache when standing, go back to bed and force fluids for another 24 hours.  6. Call your physician for a follow-up appointment.  The results of your myelogram will be sent directly to your physician by the following day.  7. If you have any questions or if complications develop after you arrive home, please call [phone].  Discharge instructions have been explained to the patient.  The patient, or the person responsible for the patient, fully understands these instructions  YOU MAY RESTART YOUR TRAMADOL TOMORROW 02/04/2017 AT 1:00PM.

## 2019-02-19 IMAGING — CR DG MYELOGRAPHY LUMBAR INJ LUMBOSACRAL
13 of 19 series · 13 of 19 positions shown · non-contrast
Comparison: None

CLINICAL DATA: Low back pain. BILATERAL leg pain, burning in his
feet. Peripheral neuropathy.
TECHNIQUE: Contiguous axial images were obtained through the Lumbar spine after
the intrathecal infusion of infusion. Coronal and sagittal
reconstructions were obtained of the axial image sets.

[w lumbar spine ap]
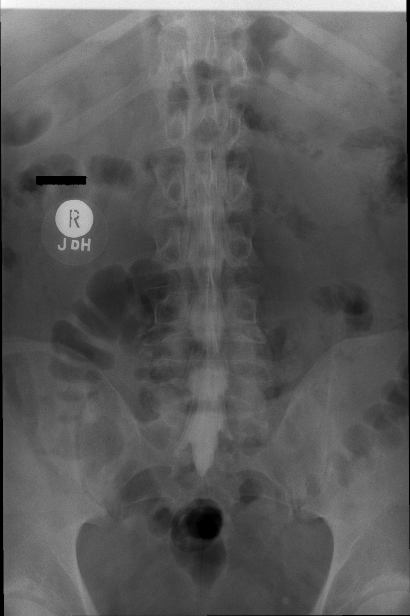

[vasc adipose (1 of 10)]
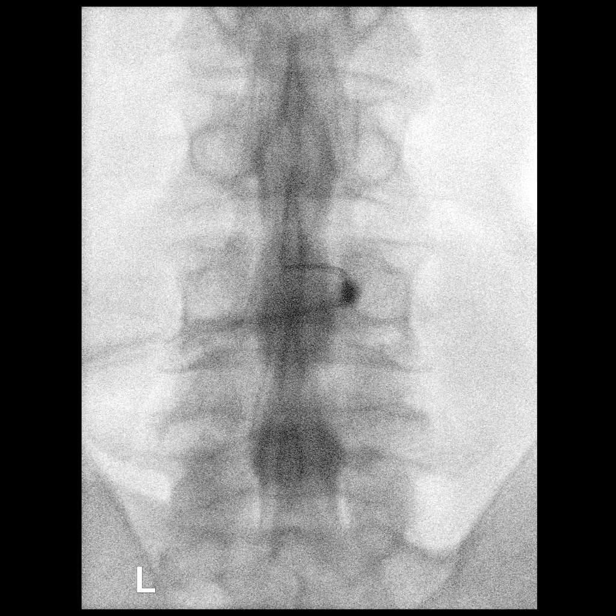

[w lumbar spine lat]
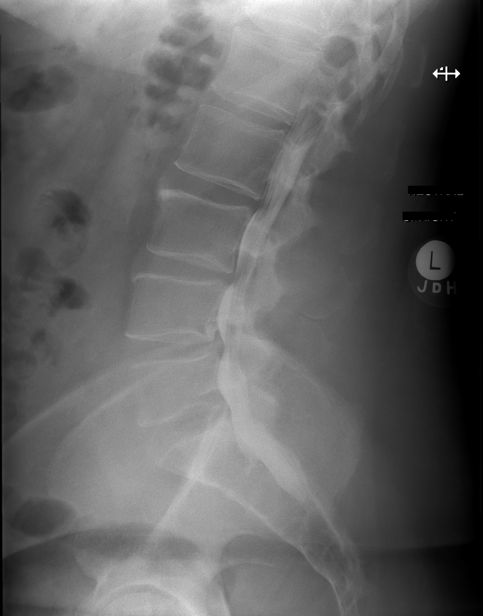

[w lumbar spine flexion]
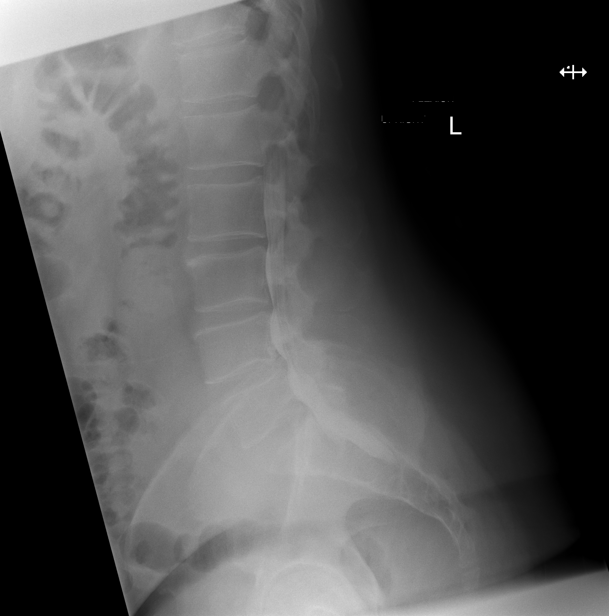

[vasc adipose (2 of 10)]
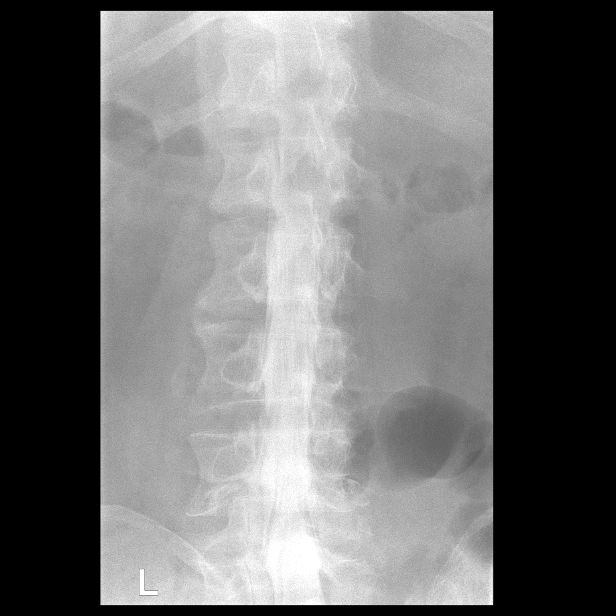

[vasc adipose (3 of 10)]
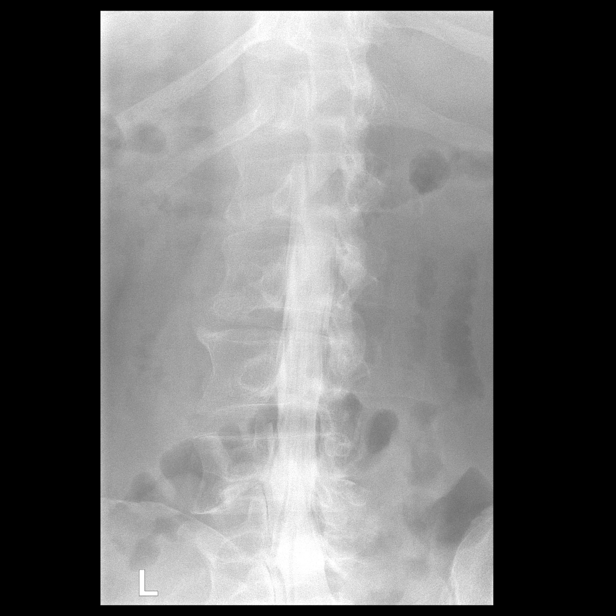

[vasc adipose (4 of 10)]
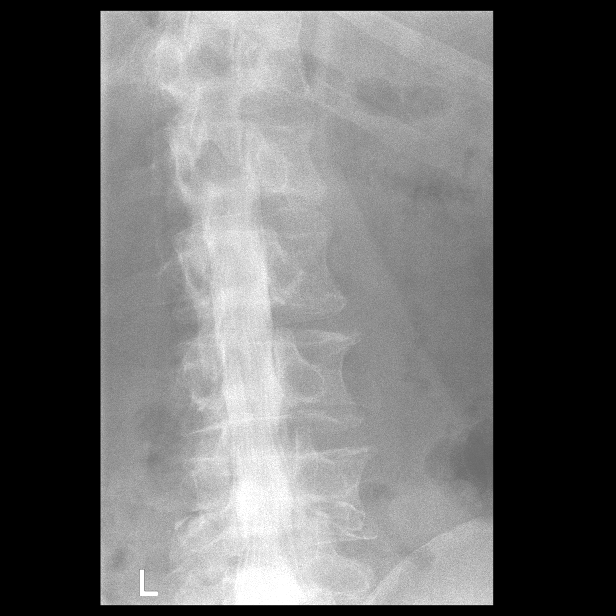

[vasc adipose (5 of 10)]
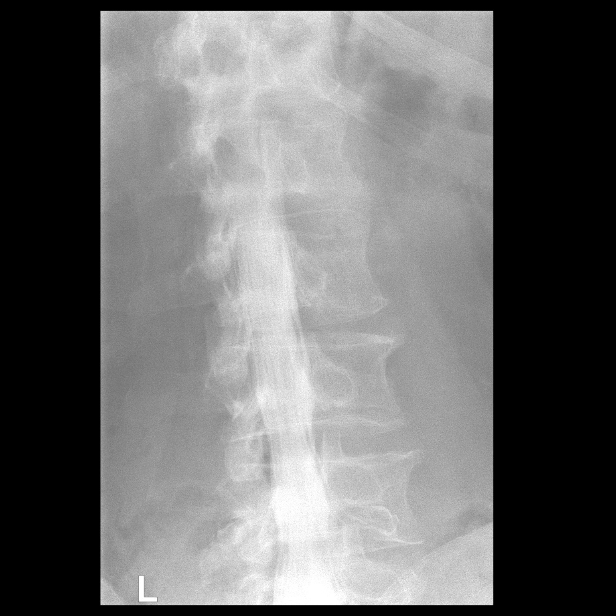

[vasc adipose (6 of 10)]
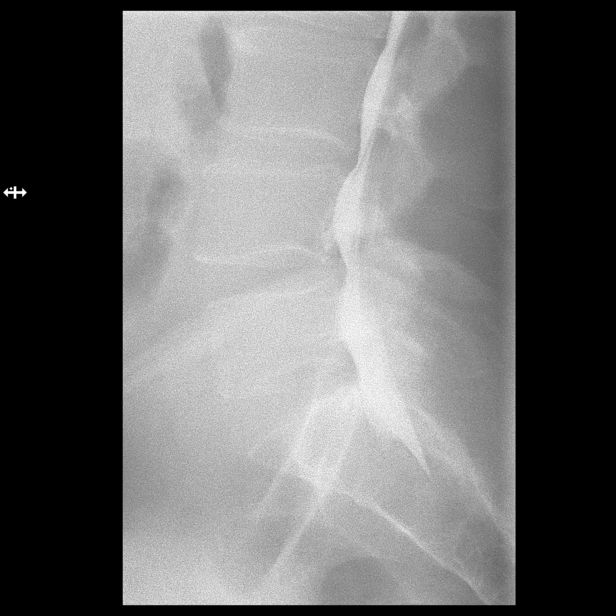

[vasc adipose (7 of 10)]
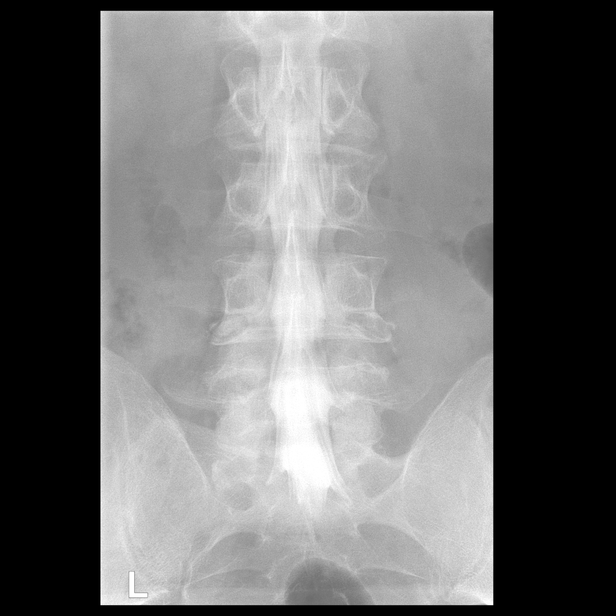

[vasc adipose (8 of 10)]
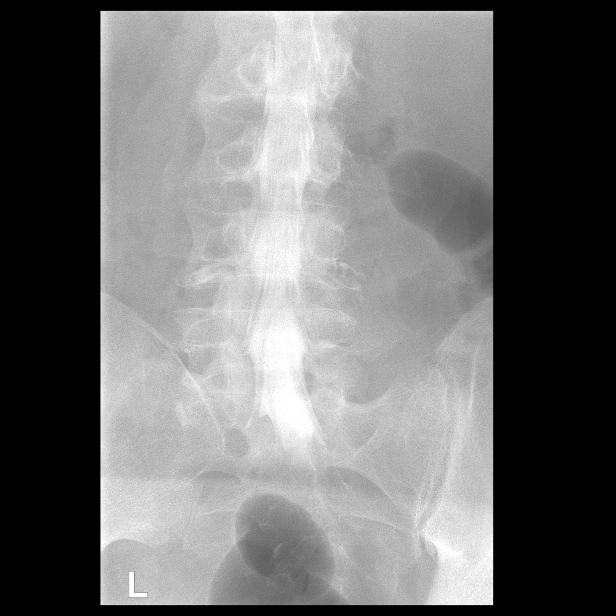

[vasc adipose (9 of 10)]
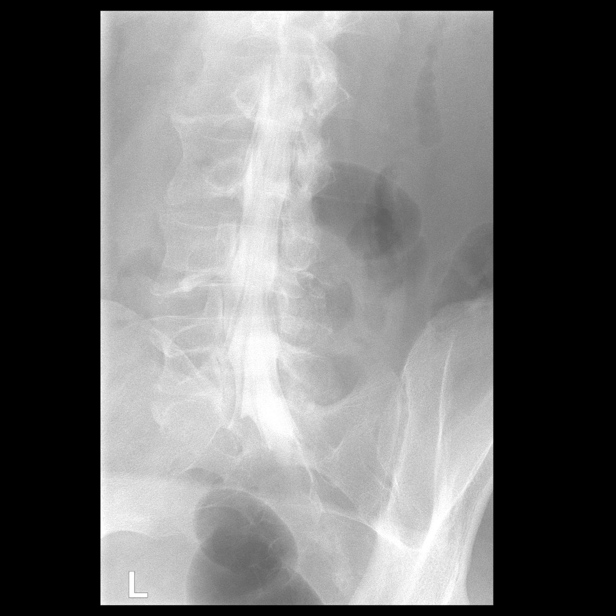

[vasc adipose (10 of 10)]
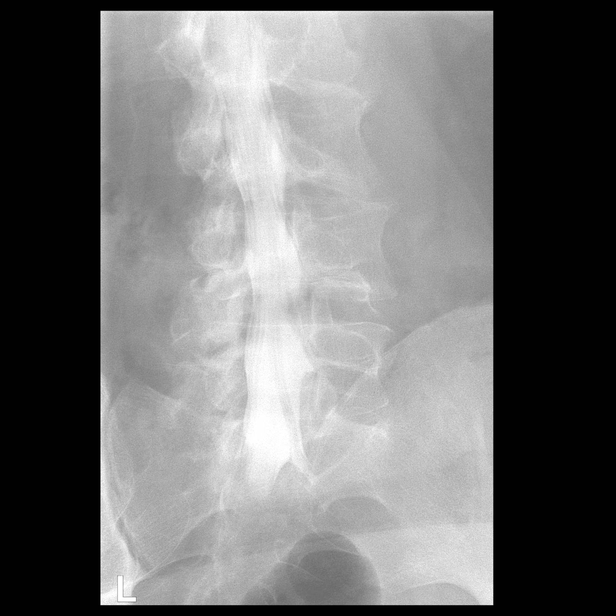

[13 of 19 positions shown; findings below may reference images not displayed]

EXAM:
LUMBAR MYELOGRAM

FLUOROSCOPY TIME:  51 seconds corresponding to a Dose Area Product
of 398.76 ?Gy*m2

PROCEDURE:
After thorough discussion of risks and benefits of the procedure
including bleeding, infection, injury to nerves, blood vessels,
adjacent structures as well as headache and CSF leak, written and
oral informed consent was obtained. Consent was obtained by Dr. Kateri
Sedamanos. Time out form was completed.

Patient was positioned prone on the fluoroscopy table. Local
anesthesia was provided with 1% lidocaine without epinephrine after
prepped and draped in the usual sterile fashion. Puncture was
performed at L3-4 using a 5 inch 22-gauge spinal needle via midline
approach. Using a single pass through the dura, the needle was
placed within the thecal sac, with return of clear CSF. 15 mL of
Isovue J-[id number] was injected into the thecal sac, with normal
opacification of the nerve roots and cauda equina consistent with
free flow within the subarachnoid space.

I personally performed the lumbar puncture and administered the
intrathecal contrast. I also personally supervised acquisition of
the myelogram images.
FINDINGS: LUMBAR MYELOGRAM FINDINGS:

Good opacification lumbar subarachnoid space. Minor ventral defects
at L3-4 and L4-5, with good preservation of disc height. Slight
effacement of the BILATERAL L4 and L5 nerve roots.

With patient standing in the AP view, stenosis and subarticular zone
narrowing develops at L4-5 and L3-4. RIGHT L5 nerve root impingement
and BILATERAL L4 nerve root effacement is observed on the upright AP
view.

With patient standing in the lateral view, anatomic alignment is
maintained throughout flexion, neutral, and extension. There is no
dynamic instability.

CT LUMBAR MYELOGRAM FINDINGS:

Segmentation: Normal.

Alignment: Normal except for trace retrolisthesis L3-4 and trace
anterolisthesis L4-5.

Vertebrae: No worrisome osseous lesion.Congenital short pedicles L2
through L5.

Conus medullaris: Normal in size, signal, and location.

Paraspinal tissues: No evidence for hydronephrosis or paravertebral
mass. Aortic atherosclerosis.

Disc levels:

L1-L2:  Unremarkable.

L2-L3: Mild congenital stenosis due to short pedicles. No
impingement.

L3-L4: Mild congenital and acquired stenosis, combination of short
pedicles, posterior element hypertrophy, and slight retrolisthesis.
Annular bulge. Ventral epidural venous prominence, physiologic or
postprocedural. Small foraminal protrusion on the RIGHT similar to
prior CT. Borderline L4 nerve root compression due to subarticular
zone narrowing. RIGHT L3 nerve nerve root impingement not clearly
established.

L4-L5: Mild-to-moderate congenital and acquired stenosis due to
short pedicles, facet arthropathy, ligamentum flavum hypertrophy,
trace anterolisthesis, and central protrusion. BILATERAL
subarticular zone narrowing could affect either L5 nerve root likely
greater on the RIGHT. BILATERAL foraminal narrowing, not clearly
compressive.

L5-S1: Unremarkable disc space. Severe LEFT-sided facet arthropathy.
Neural foraminal narrowing on the LEFT could affect the L5 nerve
root.

Compared with 1554 MRI lumbar, the stenosis at L[DATE] be more
parent.
IMPRESSION: LUMBAR MYELOGRAM IMPRESSION:

Congenital and acquired stenosis at L3-4 and L4-5 is apparent only
with patient upright. There is no dynamic instability on lateral
flexion extension views.

CT LUMBAR MYELOGRAM IMPRESSION:

Congenital and acquired stenosis at L4-5 relates to trace
anterolisthesis, posterior element hypertrophy, short pedicles, and
central protrusion. BILATERAL subarticular zone narrowing could
affect either L5 nerve root.

Similar less severe congenital and acquired stenosis at L3-4,
related to trace retrolisthesis, posterior element hypertrophy, and
annular bulge. Ventral epidural venous prominence likely
physiologic. Small foraminal protrusion on the RIGHT at L3-4, not
clearly compressive.

Asymmetric facet arthropathy at L5-S1 LEFT narrows the neural
foramen. Correlate clinically for LEFT L5 nerve root impingement.

Aortic Atherosclerosis ([id number]-D1L.L).

## 2019-02-20 ENCOUNTER — Ambulatory Visit: Payer: BLUE CROSS/BLUE SHIELD

## 2019-02-20 ENCOUNTER — Ambulatory Visit: Payer: Medicare Other | Attending: Internal Medicine

## 2019-02-20 DIAGNOSIS — Z23 Encounter for immunization: Secondary | ICD-10-CM | POA: Insufficient documentation

## 2019-02-20 NOTE — Progress Notes (Signed)
   Covid-19 Vaccination Clinic  Name:  LAVANCE BEAZER    MRN: [id number] DOB: 1955-05-29  02/20/2019  Mr. Dooner was observed post Covid-19 immunization for 15 minutes without incidence. He was provided with Vaccine Information Sheet and instruction to access the V-Safe system.   Mr. Evetts was instructed to call 911 with any severe reactions post vaccine: Marland Kitchen Difficulty breathing  . Swelling of your face and throat  . A fast heartbeat  . A bad rash all over your body  . Dizziness and weakness    Immunizations Administered    Name Date Dose VIS Date Route   Pfizer COVID-19 Vaccine 02/20/2019  5:37 PM 0.3 mL 01/11/2019 Intramuscular   Manufacturer: [employer], [employer]   Lot: [id number]   NDC: [address]-5

## 2019-03-13 ENCOUNTER — Ambulatory Visit: Payer: Medicare Other | Attending: Internal Medicine

## 2019-03-13 DIAGNOSIS — Z23 Encounter for immunization: Secondary | ICD-10-CM | POA: Insufficient documentation

## 2019-03-13 NOTE — Progress Notes (Signed)
   Covid-19 Vaccination Clinic  Name:  Douglas Khan    MRN: [id number] DOB: 1955/10/23  03/13/2019  Douglas Khan was observed post Covid-19 immunization for 15 minutes without incidence. He was provided with Vaccine Information Sheet and instruction to access the V-Safe system.   Douglas Khan was instructed to call 911 with any severe reactions post vaccine: Marland Kitchen Difficulty breathing  . Swelling of your face and throat  . A fast heartbeat  . A bad rash all over your body  . Dizziness and weakness    Immunizations Administered    Name Date Dose VIS Date Route   Pfizer COVID-19 Vaccine 03/13/2019 10:13 AM 0.3 mL 01/11/2019 Intramuscular   Manufacturer: [employer], [employer]   Lot: [id number]   NDC: [address]-6

## 2019-03-19 ENCOUNTER — Ambulatory Visit: Payer: BLUE CROSS/BLUE SHIELD

## 2021-03-08 ENCOUNTER — Telehealth: Payer: Self-pay | Admitting: Cardiology

## 2021-03-09 NOTE — Telephone Encounter (Signed)
His wife responds on MyChart message

## 2021-03-17 ENCOUNTER — Encounter (HOSPITAL_COMMUNITY): Payer: Self-pay | Admitting: *Deleted

## 2021-03-17 NOTE — Progress Notes (Signed)
Received notification from [facility] at [facility] of recent admission for NSTEMI and new onset of afib RVR.  Completed f/u on 2/8. Reviewed  accompanying medical records.  Reviewed in [facility] follow up notes from 03/05/21 hammertoe correction.  Pt seen on 2/13 for suture removal by Dr. Sharla Kidney.  Currently pt in surgical shoe for 2 1/2 weeks.  No indication of ability to bear weight on foot nor exercise guidelines.   Next follow up is on 3/2.Pt must be in a closed toe shoe  and clearance by Dr. Sharla Kidney to participate in Cardiac Rehab. Pt has upcoming appt with Dr. Einar Gip local cardiologist establishing cardiology care in [place]. Once evaluated, Dr. Einar Gip can place electronic referral for cardiac rehab and conduct 12 lead ekg. Once completed, will reach out to Dr. Sharla Kidney for advisement and guidance for activity and weight bearing. Cherre Huger, BSN Cardiac and Training and development officer

## 2021-03-22 ENCOUNTER — Ambulatory Visit: Payer: Medicare Other | Admitting: Cardiology

## 2021-03-22 ENCOUNTER — Encounter: Payer: Self-pay | Admitting: Cardiology

## 2021-03-22 ENCOUNTER — Other Ambulatory Visit: Payer: Self-pay

## 2021-03-22 VITALS — BP 135/81 | HR 61 | Temp 98.2°F | Resp 17 | Ht 72.0 in | Wt 279.4 lb

## 2021-03-22 DIAGNOSIS — I4510 Unspecified right bundle-branch block: Secondary | ICD-10-CM

## 2021-03-22 DIAGNOSIS — N1831 Chronic kidney disease, stage 3a: Secondary | ICD-10-CM

## 2021-03-22 DIAGNOSIS — I5023 Acute on chronic systolic (congestive) heart failure: Secondary | ICD-10-CM

## 2021-03-22 DIAGNOSIS — I10 Essential (primary) hypertension: Secondary | ICD-10-CM

## 2021-03-22 DIAGNOSIS — I480 Paroxysmal atrial fibrillation: Secondary | ICD-10-CM

## 2021-03-22 MED ORDER — AMIODARONE HCL 400 MG PO TABS: 200.0000 mg | ORAL_TABLET | Freq: Every day | ORAL | Status: DC

## 2021-03-22 MED ORDER — ENTRESTO 49-51 MG PO TABS: 1.0000 | ORAL_TABLET | Freq: Two times a day (BID) | ORAL | 2 refills | Status: DC

## 2021-03-22 MED ORDER — EMPAGLIFLOZIN 10 MG PO TABS: 10.0000 mg | ORAL_TABLET | Freq: Every day | ORAL | 1 refills | Status: DC

## 2021-03-22 MED ORDER — RIVAROXABAN 20 MG PO TABS: 20.0000 mg | ORAL_TABLET | Freq: Every day | ORAL | 3 refills | Status: DC

## 2021-03-22 MED ORDER — METOPROLOL SUCCINATE ER 50 MG PO TB24: 50.0000 mg | ORAL_TABLET | Freq: Every day | ORAL | 2 refills | Status: DC

## 2021-03-22 MED ORDER — AMIODARONE HCL 200 MG PO TABS: 200.0000 mg | ORAL_TABLET | Freq: Every day | ORAL | 0 refills | Status: DC

## 2021-03-22 MED ORDER — BUMETANIDE 0.5 MG PO TABS: 0.5000 mg | ORAL_TABLET | ORAL | 2 refills | Status: DC

## 2021-03-22 NOTE — Progress Notes (Signed)
Primary Physician/Referring:  Margarito Courser, MD  Patient ID: Douglas Khan, male    DOB: 28-Apr-1955, 66 y.o.   MRN: [id number]  Chief Complaint  Patient presents with   New Patient (Initial Visit)   Congestive Heart Failure   Atrial Fibrillation   HPI:    Douglas Khan  is a 66 y.o. Caucasian male patient with hypertension, hyperlipidemia, idiopathic peripheral neuropathy, chronic stage IIIa kidney disease, OSA and CPAP admitted to [facility] on 02/26/2021 with fever, nausea and generalized weakness and also A-fib with RVR and acute pulmonary edema, underwent cardiac catheterization on 02/28/2021 revealing nonischemic cardiomyopathy with severe LV systolic dysfunction.  He also underwent direct-current cardioversion on 03/05/2021 and was started on amiodarone.  He now presents to establish care with me.  He had a 11-day stay.  He now presents for follow-up, states that he is feeling a whole lot improved but still feels easily fatigued.  No chest pain, palpitations.  He is accompanied by his wife.  Past Medical History:  Diagnosis Date   Hypertension    Narcolepsy    Peripheral neuropathy    BOTH FEET   Sleep apnea    USES C-PAP   Past Surgical History:  Procedure Laterality Date   BICEPS TENDON REPAIR  RT   DENTAL IMPLANT     NASAL SEPTUM SURGERY     QUADRICEPS TENDON REPAIR  09/08/2011   Procedure: REPAIR QUADRICEP TENDON;  Surgeon: Tobi Bastos, MD;  Location: [facility];  Service: Orthopedics;  Laterality: Left;  withtissue mend graft   TARSAL TUNNEL RELEASE     both feet   TONSILLECTOMY     UVULECTOMY     VEIN LIGATION AND STRIPPING     LEGS   History reviewed. No pertinent family history.  Social History   Tobacco Use   Smoking status: Never   Smokeless tobacco: Not on file  Substance Use Topics   Alcohol use: Yes    Comment: OCCASIONAL   Marital Status: Married  ROS  Review of Systems  Cardiovascular:  Negative for chest pain, dyspnea  on exertion and leg swelling.  Objective  Blood pressure 135/81, pulse 61, temperature 98.2 F (36.8 C), temperature source Temporal, resp. rate 17, height 6' (1.829 m), weight 279 lb 6.4 oz (126.7 kg), SpO2 98 %. Body mass index is 37.89 kg/m.  Vitals with BMI 03/22/2021 02/03/2017 02/03/2017  Height _0  - -  Weight 279 lbs 6 oz - -  BMI 26.33 - -  Systolic 354 562 563  Diastolic 81 77 79  Pulse 61 64 69     Physical Exam Constitutional:      Appearance: He is morbidly obese.  Neck:     Vascular: No JVD.  Cardiovascular:     Rate and Rhythm: Normal rate and regular rhythm.     Pulses: Intact distal pulses.     Heart sounds: Normal heart sounds. No murmur heard.   No gallop.  Pulmonary:     Effort: Pulmonary effort is normal.     Breath sounds: Normal breath sounds.  Abdominal:     General: Bowel sounds are normal.     Palpations: Abdomen is soft.  Musculoskeletal:     Right lower leg: No edema.     Left lower leg: No edema.     Medications and allergies   Allergies  Allergen Reactions   Amoxicillin-Pot Clavulanate Nausea And Vomiting    Other reaction(s): Other GI issues with  diarrhea and nausea    Dust Mite Extract     Other reaction(s): Other (See Comments) Congestion/sinusitis   Molds & Smuts     Other reaction(s): Other (See Comments) Congestion sinusitis   Pollen Extract     Other reaction(s): Other Congestion/sinusitis Congestion/sinusitis     Medication list after today's encounter   Current Outpatient Medications:    Calcium Citrate-Vitamin D3 (CALCIUM CITRATE + D) 315-6.25 MG-MCG TABS, Take 1 tablet by mouth daily., Disp: , Rfl:    Cholecalciferol (VITAMIN D3) 5000 units CAPS, Take 1 capsule by mouth daily., Disp: , Rfl:    fexofenadine (ALLEGRA) 180 MG tablet, Take 180 mg by mouth daily., Disp: , Rfl:    fluticasone (FLONASE) 50 MCG/ACT nasal spray, Place 1 spray into both nostrils daily., Disp: , Rfl:    Lipoic Acid 150 MG CAPS, Take 300 mg by  mouth 2 (two) times daily., Disp: , Rfl:    pregabalin (LYRICA) 100 MG capsule, Take 100 mg by mouth daily., Disp: , Rfl:    Probiotic Product (PROBIOTIC DAILY PO), Take 1 tablet by mouth daily., Disp: , Rfl:    rosuvastatin (CRESTOR) 5 MG tablet, Take 5 mg by mouth daily., Disp: , Rfl:    sacubitril-valsartan (ENTRESTO) 49-51 MG, Take 1 tablet by mouth 2 (two) times daily., Disp: 60 tablet, Rfl: 2   Saw Palmetto, Serenoa repens, 320 MG CAPS, Take 320 mg by mouth daily., Disp: , Rfl:    sodium chloride (OCEAN) 0.65 % nasal spray, Place 1 spray into the nose daily as needed for congestion., Disp: , Rfl:    testosterone (ANDROGEL) 50 MG/5GM GEL, Place 5 g onto the skin daily., Disp: , Rfl:    tiZANidine (ZANAFLEX) 4 MG tablet, Take 4 mg by mouth every 6 (six) hours as needed for muscle spasms., Disp: , Rfl:    traMADol (ULTRAM) 50 MG tablet, Take 50 mg by mouth every 6 (six) hours as needed for moderate pain. Reported on 06/23/2015, Disp: , Rfl:    amiodarone (PACERONE) 400 MG tablet, Take 0.5 tablets (200 mg total) by mouth daily., Disp: , Rfl:    bumetanide (BUMEX) 0.5 MG tablet, Take 1 tablet (0.5 mg total) by mouth every morning., Disp: 30 tablet, Rfl: 2   empagliflozin (JARDIANCE) 10 MG TABS tablet, Take 1 tablet (10 mg total) by mouth daily., Disp: 90 tablet, Rfl: 1   metoprolol succinate (TOPROL-XL) 50 MG 24 hr tablet, Take 1 tablet (50 mg total) by mouth daily. Take with or immediately following a meal., Disp: 30 tablet, Rfl: 2   rivaroxaban (XARELTO) 20 MG TABS tablet, Take 1 tablet (20 mg total) by mouth daily with supper., Disp: 180 tablet, Rfl: 3  Laboratory examination:   External labs:   Labs 03/17/2021:  BUN 19, creatinine 1.43, EGFR 54 mL, potassium 4.1.  TSH and free T4 normal.  Labs 02/26/2021:  Hb 17.5/HCT 51.0, platelets 163.  Normal indicis.  Serum glucose 127, BUN 26, creatinine 1.5, EGFR 59 mL.  LFTs normal.  Lipid profile 05/05/2020: Total cholesterol 144,  triglycerides 94, HDL 58, LDL 69.  Radiology:   Chest x-ray single view 02/26/2021: Cardiomegaly present.  No focal infiltrate or consolidation.  No CHF  CT angiogram of the chest and abdomen 02/26/2021: No pulmonary edema. Normal thoracic aorta.  There is small pericardial effusion.  Otherwise no significant abnormality.  Normal abdominal aorta.  Cardiac Studies:   Echocardiogram 02/27/2021: LVEF severely reduced and estimated at 25 to 30%, normal LV size,  restrictive pattern of LV diastolic filling.  Abnormal septal motion.  Mild to moderate LVH. Normal RV size mildly reduced RV systolic function. Mild left atrial enlargement. Trace pericardial effusion.  Cardiac Catheterization 02/28/2021:   Right heart catheterization findings:   1- Mean right atrial pressure was 16 mmHg.   2- RV systolic pressure was 94/85 mmHg  3- Pulmonary artery pressure was 38/23 mmHg with mean pulmonary pressure of 25 mmHg. 4- Pulmonary capillary wedge pressure was 27 mmHg. 5- Pulmonary artery saturation was 67.1%, right atrial saturation 70.9%, and RV saturation 67.6% and aortic saturation was measured at 96.5%.   6- Cardiac output by Fick method was measured at 5.23 L/min with cardiac index of 2.05 L/min/m.   Angiographic findings: LM normal RI ostial 65% stenosis.  Small vessel. LAD is small to normal.  D1 has ostial 75% stenosis, small vessel. LCx is large vessel, small to normal. RCA: Large and dominant.  Normal.  EKG:   EKG 03/22/2021: Normal sinus rhythm with rate of 56 bpm, left axis deviation, left anterior fascicular block.  Right bundle branch block.  Diffuse T wave abnormality, cannot exclude anterolateral ischemia, inferior ischemia.    Assessment     ICD-10-CM   1. Right bundle branch block  I45.10     2. Paroxysmal atrial fibrillation (HCC)  I48.0 EKG 12-Lead    rivaroxaban (XARELTO) 20 MG TABS tablet    amiodarone (PACERONE) 400 MG tablet    DISCONTINUED: amiodarone (PACERONE)  400 MG tablet    DISCONTINUED: rivaroxaban (XARELTO) 20 MG TABS tablet    DISCONTINUED: amiodarone (PACERONE) 200 MG tablet    DISCONTINUED: amiodarone (PACERONE) 400 MG tablet    3. Acute on chronic systolic congestive heart failure (HCC)  I50.23 bumetanide (BUMEX) 0.5 MG tablet    metoprolol succinate (TOPROL-XL) 50 MG 24 hr tablet    sacubitril-valsartan (ENTRESTO) 49-51 MG    Basic metabolic panel    Pro b natriuretic peptide (BNP)    empagliflozin (JARDIANCE) 10 MG TABS tablet    AMB referral to cardiac rehabilitation    DISCONTINUED: bumetanide (BUMEX) 2 MG tablet    DISCONTINUED: empagliflozin (JARDIANCE) 10 MG TABS tablet    4. Primary hypertension  I10     5. Stage 3a chronic kidney disease (HCC)  N18.31       CHA2DS2-VASc Score is 4.  Yearly risk of stroke: 4.8% (A, HTN, CHF and CAD).  Score of 1=0.6; 2=2.2; 3=3.2; 4=4.8; 5=7.2; 6=9.8; 7=>9.8) -(CHF; HTN; vasc disease DM,  Male = 1; Age <65 =0; 65-74 = 1,  >75 =2; stroke/embolism= 2).    Medications Discontinued During This Encounter  Medication Reason   tadalafil (CIALIS) 20 MG tablet    Multiple Vitamin (MULTIVITAMIN) tablet    lamoTRIgine (LAMICTAL) 200 MG tablet    gabapentin (NEURONTIN) 600 MG tablet    amLODipine-benazepril (LOTREL) 10-20 MG capsule    metoprolol tartrate (LOPRESSOR) 50 MG tablet    amiodarone (PACERONE) 400 MG tablet Reorder   bumetanide (BUMEX) 2 MG tablet Reorder   metoprolol succinate (TOPROL-XL) 50 MG 24 hr tablet Reorder   aspirin 81 MG tablet Discontinued by provider   empagliflozin (JARDIANCE) 10 MG TABS tablet Reorder   rivaroxaban (XARELTO) 20 MG TABS tablet Reorder   amiodarone (PACERONE) 200 MG tablet    amiodarone (PACERONE) 400 MG tablet     Meds ordered this encounter  Medications   DISCONTD: amiodarone (PACERONE) 200 MG tablet    Sig: Take 1 tablet (200 mg total) by  mouth daily. 0.5 MG TWICE DAILY    Dispense:  90 tablet    Refill:  0   bumetanide (BUMEX) 0.5 MG tablet     Sig: Take 1 tablet (0.5 mg total) by mouth every morning.    Dispense:  30 tablet    Refill:  2   metoprolol succinate (TOPROL-XL) 50 MG 24 hr tablet    Sig: Take 1 tablet (50 mg total) by mouth daily. Take with or immediately following a meal.    Dispense:  30 tablet    Refill:  2   sacubitril-valsartan (ENTRESTO) 49-51 MG    Sig: Take 1 tablet by mouth 2 (two) times daily.    Dispense:  60 tablet    Refill:  2   empagliflozin (JARDIANCE) 10 MG TABS tablet    Sig: Take 1 tablet (10 mg total) by mouth daily.    Dispense:  90 tablet    Refill:  1   rivaroxaban (XARELTO) 20 MG TABS tablet    Sig: Take 1 tablet (20 mg total) by mouth daily with supper.    Dispense:  180 tablet    Refill:  3   DISCONTD: amiodarone (PACERONE) 400 MG tablet    Sig: Take 0.5 tablets (200 mg total) by mouth daily. 0.5 MG TWICE DAILY   amiodarone (PACERONE) 400 MG tablet    Sig: Take 0.5 tablets (200 mg total) by mouth daily.   Orders Placed This Encounter  Procedures   Basic metabolic panel   Pro b natriuretic peptide (BNP)   AMB referral to cardiac rehabilitation    Referral Priority:   Routine    Referral Type:   Consultation    Number of Visits Requested:   1   EKG 12-Lead   Recommendations:   Douglas Khan is a 66 y.o.  Caucasian male patient with hypertension, hyperlipidemia, idiopathic peripheral neuropathy, chronic stage IIIa kidney disease, OSA and CPAP admitted to [facility] on 02/26/2021 with fever, nausea and generalized weakness and also A-fib with RVR and acute pulmonary edema, underwent cardiac catheterization on 02/28/2021 revealing nonischemic cardiomyopathy with severe LV systolic dysfunction.  He also underwent direct-current cardioversion on 03/05/2021 and was started on amiodarone.  He now presents to establish care with me.  He had a 11-day stay.  Patient was discharged home on LifeVest.  I do not see any indication for LifeVest in the absence of syncope  or significant arrhythmias, asymptomatic medical records were reviewed.  He has nonischemic cardiomyopathy probably related to chronic A-fib with RVR that started sometime in January 2023 when he started noticing shortness of breath and fatigue.  He is presently maintaining sinus rhythm.  We will reduce the dose of amiodarone from 400 mg twice daily to 200 mg daily.  Continue Xarelto.  As he does not have significant coronary disease, his mildly elevated cardiac troponins were related to heart failure, will discontinue aspirin.  His renal function has remained stable, recent BMP reveals creatinine is back to baseline around 1.4-1.6.  I will start him on Entresto 49/51 mg p.o. twice daily.  Continue Jardiance 10 mg daily.  Will obtain repeat BMP and BNP in 10 days to 2 weeks.  Reduce the dose of Bumex from 2 mg twice daily to 1 mg daily as he appears to be well compensated.  I have also had extensive discussion with the patient and his wife regarding lifestyle modification, calorie reduction and weight loss and compliance with CPAP.  Referral to  cardiac rehab made.  I would like to see him back in 2 to 3 weeks for close monitoring.  Suspect his LVEF will improve if he continues to maintain sinus rhythm.  We could certainly consider discontinuing amiodarone or reducing the dose to 100 mg daily to reduce risk of long-term side effects or consider switching to Multaq.    Adrian Prows, MD, [facility] 03/23/2021, 2:07 PM Office: [phone]

## 2021-03-23 NOTE — Telephone Encounter (Signed)
From patient.

## 2021-03-24 ENCOUNTER — Telehealth: Payer: Self-pay

## 2021-03-24 NOTE — Telephone Encounter (Signed)
Cardiac rehab at [facility] in [place]. Referral made already.

## 2021-03-24 NOTE — Telephone Encounter (Signed)
Pt is calling because he has not heard anything from cardiac rehab. He said you had placed the order. Please advise.

## 2021-04-06 ENCOUNTER — Telehealth: Payer: Self-pay

## 2021-04-06 LAB — BASIC METABOLIC PANEL
BUN/Creatinine Ratio: 13 (ref 10–24)
BUN: 17 mg/dL (ref 8–27)
CO2: 17 mmol/L — ABNORMAL LOW (ref 20–29)
Calcium: 9.6 mg/dL (ref 8.6–10.2)
Chloride: 108 mmol/L — ABNORMAL HIGH (ref 96–106)
Creatinine, Ser: 1.29 mg/dL — ABNORMAL HIGH (ref 0.76–1.27)
Glucose: 91 mg/dL (ref 70–99)
Potassium: 4.2 mmol/L (ref 3.5–5.2)
Sodium: 142 mmol/L (ref 134–144)
eGFR: 61 mL/min/{1.73_m2} (ref >59)

## 2021-04-06 LAB — PRO B NATRIURETIC PEPTIDE: NT-Pro BNP: 389 pg/mL — ABNORMAL HIGH (ref 0–376)

## 2021-04-15 ENCOUNTER — Other Ambulatory Visit: Payer: Self-pay

## 2021-04-15 ENCOUNTER — Ambulatory Visit: Payer: Medicare Other | Admitting: Cardiology

## 2021-04-15 ENCOUNTER — Encounter: Payer: Self-pay | Admitting: Cardiology

## 2021-04-15 VITALS — BP 129/71 | HR 57 | Temp 98.7°F | Resp 16 | Ht 72.0 in | Wt 280.6 lb

## 2021-04-15 DIAGNOSIS — I480 Paroxysmal atrial fibrillation: Secondary | ICD-10-CM

## 2021-04-15 DIAGNOSIS — I10 Essential (primary) hypertension: Secondary | ICD-10-CM

## 2021-04-15 NOTE — Progress Notes (Signed)
? ?Primary Physician/Referring:  Margarito Courser, MD ? ?Patient ID: Douglas Khan, male    DOB: Feb 22, 1955, 66 y.o.   MRN: [id number] ? ?No chief complaint on file. ? ?HPI:   ? ?Douglas Khan  is a 66 y.o. Caucasian male patient with hypertension, hyperlipidemia, idiopathic peripheral neuropathy, chronic stage IIIa kidney disease, OSA and CPAP admitted to [facility] on 02/26/2021 with fever, nausea and generalized weakness and also A-fib with RVR and acute pulmonary edema, underwent cardiac catheterization on 02/28/2021 revealing nonischemic cardiomyopathy with severe LV systolic dysfunction.  He also underwent direct-current cardioversion on 03/05/2021 and was started on amiodarone.  He had a 11-day stay. ? ?On his last office visit I discontinued LifeVest, reduced his amiodarone from 400 mg twice daily to 200 mg daily and started him on Entresto 49/51 mg daily.  States that his dyspnea has improved significantly.  States that he has made significant lifestyle changes with regard to his diet.  He has not had any PND orthopnea or leg edema.  He had a cast due to tendon tear on his right foot which is now off and he is eager to start exercises.  No chest pain, palpitations.  He is accompanied by his wife. ? ?Past Medical History:  ?Diagnosis Date  ? CHF (congestive heart failure) ([place])   ? Hyperlipidemia   ? Hypertension   ? Narcolepsy   ? Peripheral neuropathy   ? BOTH FEET  ? Sleep apnea   ? USES C-PAP  ? ?Past Surgical History:  ?Procedure Laterality Date  ? BICEPS TENDON REPAIR  RT  ? DENTAL IMPLANT    ? NASAL SEPTUM SURGERY    ? QUADRICEPS TENDON REPAIR  09/08/2011  ? Procedure: REPAIR QUADRICEP TENDON;  Surgeon: Tobi Bastos, MD;  Location: [facility];  Service: Orthopedics;  Laterality: Left;  withtissue mend graft  ? TARSAL TUNNEL RELEASE    ? both feet  ? TONSILLECTOMY    ? UVULECTOMY    ? VEIN LIGATION AND STRIPPING    ? LEGS  ? ?Family History  ?Problem Relation Age of Onset  ?  Hypertension Father   ? Heart disease Father   ? Epilepsy Sister   ? Other Brother   ?     Farm accident  ?  ?Social History  ? ?Tobacco Use  ? Smoking status: Never  ? Smokeless tobacco: Not on file  ?Substance Use Topics  ? Alcohol use: Not Currently  ?  Comment: OCCASIONAL  ? ?Marital Status: Married  ?ROS  ?Review of Systems  ?Cardiovascular:  Negative for chest pain, dyspnea on exertion and leg swelling.  ?Objective  ?Blood pressure 129/71, pulse (!) 57, temperature 98.7 ?F (37.1 ?C), temperature source Temporal, resp. rate 16, height 6' (1.829 m), weight 280 lb 9.6 oz (127.3 kg), SpO2 94 %. Body mass index is 38.06 kg/m?.  ?Vitals with BMI 04/15/2021 03/22/2021 02/03/2017  ?Height $Remove'6\' 0"'fxJOEgU$  $RemoveB'6\' 0"'RtvfqSUk$  -  ?Weight 280 lbs 10 oz 279 lbs 6 oz -  ?BMI 38.05 37.89 -  ?Systolic 297 989 211  ?Diastolic 71 81 77  ?Pulse 57 61 64  ?  ? Physical Exam ?Constitutional:   ?   Appearance: He is morbidly obese.  ?Neck:  ?   Vascular: No JVD.  ?Cardiovascular:  ?   Rate and Rhythm: Normal rate and regular rhythm.  ?   Pulses: Intact distal pulses.  ?   Heart sounds: Normal heart sounds. No murmur heard. ?  No gallop.  ?Pulmonary:  ?   Effort: Pulmonary effort is normal.  ?   Breath sounds: Normal breath sounds.  ?Abdominal:  ?   General: Bowel sounds are normal.  ?   Palpations: Abdomen is soft.  ?Musculoskeletal:  ?   Right lower leg: No edema.  ?   Left lower leg: No edema.  ?  ?Medications and allergies  ? ?Allergies  ?Allergen Reactions  ? Amoxicillin-Pot Clavulanate Nausea And Vomiting  ?  Other reaction(s): Other ?GI issues with diarrhea and nausea ?  ? Dust Mite Extract   ?  Other reaction(s): Other (See Comments) ?Congestion/sinusitis  ? Molds & Smuts   ?  Other reaction(s): Other (See Comments) ?Congestion sinusitis  ? Pollen Extract   ?  Other reaction(s): Other ?Congestion/sinusitis ?Congestion/sinusitis  ?  ?Medication list after today's encounter  ? ?Current Outpatient Medications:  ?  bumetanide (BUMEX) 0.5 MG tablet, Take 1  tablet (0.5 mg total) by mouth every morning., Disp: 30 tablet, Rfl: 2 ?  Calcium Citrate-Vitamin D3 315-6.25 MG-MCG TABS, Take 1 tablet by mouth daily., Disp: , Rfl:  ?  Cholecalciferol (VITAMIN D3) 5000 units CAPS, Take 1 capsule by mouth daily., Disp: , Rfl:  ?  empagliflozin (JARDIANCE) 10 MG TABS tablet, Take 1 tablet (10 mg total) by mouth daily., Disp: 90 tablet, Rfl: 1 ?  fexofenadine (ALLEGRA) 180 MG tablet, Take 180 mg by mouth daily., Disp: , Rfl:  ?  fluticasone (FLONASE) 50 MCG/ACT nasal spray, Place 1 spray into both nostrils daily., Disp: , Rfl:  ?  Lipoic Acid 150 MG CAPS, Take 300 mg by mouth 2 (two) times daily., Disp: , Rfl:  ?  metoprolol succinate (TOPROL-XL) 50 MG 24 hr tablet, Take 1 tablet (50 mg total) by mouth daily. Take with or immediately following a meal., Disp: 30 tablet, Rfl: 2 ?  pregabalin (LYRICA) 100 MG capsule, Take 100 mg by mouth daily., Disp: , Rfl:  ?  Probiotic Product (PROBIOTIC DAILY PO), Take 1 tablet by mouth daily., Disp: , Rfl:  ?  rivaroxaban (XARELTO) 20 MG TABS tablet, Take 1 tablet (20 mg total) by mouth daily with supper., Disp: 180 tablet, Rfl: 3 ?  rosuvastatin (CRESTOR) 5 MG tablet, Take 5 mg by mouth daily., Disp: , Rfl:  ?  sacubitril-valsartan (ENTRESTO) 49-51 MG, Take 1 tablet by mouth 2 (two) times daily., Disp: 60 tablet, Rfl: 2 ?  Saw Palmetto, Serenoa repens, 320 MG CAPS, Take 320 mg by mouth daily., Disp: , Rfl:  ?  sodium chloride (OCEAN) 0.65 % nasal spray, Place 1 spray into the nose daily as needed for congestion., Disp: , Rfl:  ?  testosterone (ANDROGEL) 50 MG/5GM GEL, Place 5 g onto the skin daily., Disp: , Rfl:  ?  tiZANidine (ZANAFLEX) 4 MG tablet, Take 4 mg by mouth every 6 (six) hours as needed for muscle spasms., Disp: , Rfl:  ?  traMADol (ULTRAM) 50 MG tablet, Take 50 mg by mouth every 6 (six) hours as needed for moderate pain. Reported on 06/23/2015, Disp: , Rfl:  ? ?Laboratory examination:  ? ?CMP Latest Ref Rng & Units 04/05/2021 06/23/2015  09/08/2011  ?Glucose 70 - 99 mg/dL 91 107(H) 102(H)  ?BUN 8 - 27 mg/dL 17 14 22   ?Creatinine 0.76 - 1.27 mg/dL 1.29(H) 1.27(H) 1.09  ?Sodium 134 - 144 mmol/L 142 139 136  ?Potassium 3.5 - 5.2 mmol/L 4.2 4.2 3.5  ?Chloride 96 - 106 mmol/L 108(H) 109 102  ?CO2 20 -  29 mmol/L 17(L) 21(L) 22  ?Calcium 8.6 - 10.2 mg/dL 9.6 9.5 9.1  ?Total Protein 6.0 - 8.3 g/dL - - 6.5  ?Total Bilirubin 0.3 - 1.2 mg/dL - - 1.2  ?Alkaline Phos 39 - 117 U/L - - 65  ?AST 0 - 37 U/L - - 47(H)  ?ALT 0 - 53 U/L - - 29  ? ?CBC Latest Ref Rng & Units 06/23/2015 09/08/2011 09/26/2006  ?WBC 4.0 - 10.5 K/uL 8.8 8.3 8.2  ?Hemoglobin 13.0 - 17.0 g/dL 17.4(H) 15.9 16.7  ?Hematocrit 39.0 - 52.0 % 51.7 43.7 47.4  ?Platelets 150 - 400 K/uL 204 204 265  ? ?Lipid Panel  ?No results found for: CHOL, TRIG, HDL, CHOLHDL, VLDL, LDLCALC, LDLDIRECT ? ?BNP ?No results found for: BNP ? ?ProBNP ?   ?Component Value Date/Time  ? PROBNP 389 (H) 04/05/2021 1021  ?  ? ?External labs:  ? ?Labs 03/17/2021: ? ?BUN 19, creatinine 1.43, EGFR 54 mL, potassium 4.1. ? ?TSH and free T4 normal. ? ?Labs 02/26/2021: ? ?Hb 17.5/HCT 51.0, platelets 163.  Normal indicis. ? ?Serum glucose 127, BUN 26, creatinine 1.5, EGFR 59 mL.  LFTs normal. ? ?Lipid profile 05/05/2020: Total cholesterol 144, triglycerides 94, HDL 58, LDL 69. ? ?Radiology:  ? ?Chest x-ray single view 02/26/2021: ?Cardiomegaly present.  No focal infiltrate or consolidation.  No CHF ? ?CT angiogram of the chest and abdomen 02/26/2021: ?No pulmonary edema. ?Normal thoracic aorta.  There is small pericardial effusion.  Otherwise no significant abnormality.  Normal abdominal aorta. ? ?Cardiac Studies:  ? ?Echocardiogram 02/27/2021: ?LVEF severely reduced and estimated at 25 to 30%, normal LV size, restrictive pattern of LV diastolic filling.  Abnormal septal motion.  Mild to moderate LVH. ?Normal RV size mildly reduced RV systolic function. ?Mild left atrial enlargement. ?Trace pericardial effusion. ? ?Cardiac Catheterization  02/28/2021:  ? ?Right heart catheterization findings: ?  ?1- Mean right atrial pressure was 16 mmHg.   ?2- RV systolic pressure was 86/76 mmHg  ?3- Pulmonary artery pressure was 38/23 mmHg with mean pulmonary pressure o

## 2021-05-24 ENCOUNTER — Ambulatory Visit: Payer: Medicare Other

## 2021-05-24 DIAGNOSIS — I5022 Chronic systolic (congestive) heart failure: Secondary | ICD-10-CM

## 2021-05-25 NOTE — Telephone Encounter (Signed)
Concerns about bloodwork. ?

## 2021-06-04 ENCOUNTER — Encounter: Payer: Self-pay | Admitting: Cardiology

## 2021-06-04 ENCOUNTER — Ambulatory Visit: Payer: Medicare Other | Admitting: Cardiology

## 2021-06-04 VITALS — BP 118/75 | HR 60 | Temp 98.7°F | Resp 14 | Ht 72.0 in | Wt 278.0 lb

## 2021-06-04 DIAGNOSIS — I2510 Atherosclerotic heart disease of native coronary artery without angina pectoris: Secondary | ICD-10-CM

## 2021-06-04 DIAGNOSIS — G4733 Obstructive sleep apnea (adult) (pediatric): Secondary | ICD-10-CM

## 2021-06-04 DIAGNOSIS — I480 Paroxysmal atrial fibrillation: Secondary | ICD-10-CM

## 2021-06-04 DIAGNOSIS — I10 Essential (primary) hypertension: Secondary | ICD-10-CM

## 2021-06-04 DIAGNOSIS — I5022 Chronic systolic (congestive) heart failure: Secondary | ICD-10-CM

## 2021-06-04 MED ORDER — METOPROLOL SUCCINATE ER 50 MG PO TB24: 50.0000 mg | ORAL_TABLET | Freq: Every day | ORAL | 3 refills | Status: DC

## 2021-06-04 MED ORDER — RIVAROXABAN 20 MG PO TABS: 20.0000 mg | ORAL_TABLET | Freq: Every day | ORAL | 3 refills | Status: DC

## 2021-06-04 MED ORDER — ENTRESTO 49-51 MG PO TABS: 1.0000 | ORAL_TABLET | Freq: Two times a day (BID) | ORAL | 3 refills | Status: DC

## 2021-06-04 MED ORDER — ROSUVASTATIN CALCIUM 5 MG PO TABS: 5.0000 mg | ORAL_TABLET | Freq: Every day | ORAL | 3 refills | Status: DC

## 2021-06-04 NOTE — Progress Notes (Signed)
? ?Primary Physician/Referring:  Margarito Courser, MD ? ?Patient ID: Douglas Khan, male    DOB: [birth year], 66 y.o.   MRN: [id number] ? ?Chief Complaint  ?Patient presents with  ? Atrial Fibrillation  ? Cardiomyopathy  ? Follow-up  ?  7 weeks  ? ? ?HPI:   ? ?KOLSON CHOVANEC  is a 66 y.o. Caucasian male patient with hypertension, hyperlipidemia, idiopathic peripheral neuropathy, chronic stage IIIa kidney disease, OSA and CPAP admitted to [facility] on 02/26/2021 with fever, nausea and generalized weakness and also A-fib with RVR and acute pulmonary edema, underwent cardiac catheterization on 02/28/2021 revealing nonischemic cardiomyopathy with severe LV systolic dysfunction.  He also underwent direct-current cardioversion on 03/05/2021 and was started on amiodarone.  He had a 11-day stay. ? ?He is presently tolerating all his medications well, he has not had any recurrence of dyspnea, overall feels well.  Remains asymptomatic. ? ?Past Medical History:  ?Diagnosis Date  ? CHF (congestive heart failure) ([place])   ? Hyperlipidemia   ? Hypertension   ? Narcolepsy   ? Peripheral neuropathy   ? BOTH FEET  ? Sleep apnea   ? USES C-PAP  ? ?Social History  ? ?Tobacco Use  ? Smoking status: Never  ? Smokeless tobacco: Not on file  ?Substance Use Topics  ? Alcohol use: Not Currently  ?  Comment: OCCASIONAL/nothing since January 2023  ? ?Marital Status: Married  ?ROS  ?Review of Systems  ?Cardiovascular:  Negative for chest pain, dyspnea on exertion and leg swelling.  ?Objective  ?Blood pressure 118/75, pulse 60, temperature 98.7 ?F (37.1 ?C), temperature source Temporal, resp. rate 14, height 6' (1.829 m), weight 278 lb (126.1 kg). Body mass index is 37.7 kg/m?.  ? ?  06/04/2021  ?  9:53 AM 04/15/2021  ?  3:09 PM 03/22/2021  ? 11:27 AM  ?Vitals with BMI  ?Height $Remove'6\' 0"'xyZSUys$  $RemoveB'6\' 0"'JaPMGAWY$  $RemoveBe'6\' 0"'EnOYQGDma$   ?Weight 278 lbs 280 lbs 10 oz 279 lbs 6 oz  ?BMI 37.7 38.05 37.89  ?Systolic 830 940 768  ?Diastolic 75 71 81  ?Pulse 60 57 61  ?  ?  Physical Exam ?Constitutional:   ?   Appearance: He is morbidly obese.  ?Neck:  ?   Vascular: No JVD.  ?Cardiovascular:  ?   Rate and Rhythm: Normal rate and regular rhythm.  ?   Pulses: Intact distal pulses.  ?   Heart sounds: Normal heart sounds. No murmur heard. ?  No gallop.  ?Pulmonary:  ?   Effort: Pulmonary effort is normal.  ?   Breath sounds: Normal breath sounds.  ?Abdominal:  ?   General: Bowel sounds are normal.  ?   Palpations: Abdomen is soft.  ?Musculoskeletal:  ?   Right lower leg: Edema (trace) present.  ?   Left lower leg: No edema.  ?  ?Medications and allergies  ? ?Allergies  ?Allergen Reactions  ? Amoxicillin-Pot Clavulanate Nausea And Vomiting  ?  Other reaction(s): Other ?GI issues with diarrhea and nausea ?  ? Dust Mite Extract   ?  Other reaction(s): Other (See Comments) ?Congestion/sinusitis  ? Molds & Smuts   ?  Other reaction(s): Other (See Comments) ?Congestion sinusitis  ? Pollen Extract   ?  Other reaction(s): Other ?Congestion/sinusitis ?Congestion/sinusitis  ?  ?Medication list after today's encounter  ? ?Current Outpatient Medications:  ?  Calcium Citrate-Vitamin D3 315-6.25 MG-MCG TABS, Take 1 tablet by mouth daily., Disp: , Rfl:  ?  Cholecalciferol (  VITAMIN D3) 5000 units CAPS, Take 1 capsule by mouth daily., Disp: , Rfl:  ?  empagliflozin (JARDIANCE) 10 MG TABS tablet, Take 1 tablet (10 mg total) by mouth daily., Disp: 90 tablet, Rfl: 1 ?  fexofenadine (ALLEGRA) 180 MG tablet, Take 180 mg by mouth daily., Disp: , Rfl:  ?  fluticasone (FLONASE) 50 MCG/ACT nasal spray, Place 1 spray into both nostrils daily., Disp: , Rfl:  ?  Lipoic Acid 150 MG CAPS, Take 300 mg by mouth 2 (two) times daily., Disp: , Rfl:  ?  pregabalin (LYRICA) 100 MG capsule, Take 100 mg by mouth daily., Disp: , Rfl:  ?  Probiotic Product (PROBIOTIC DAILY PO), Take 1 tablet by mouth daily., Disp: , Rfl:  ?  Saw Palmetto, Serenoa repens, 320 MG CAPS, Take 320 mg by mouth daily., Disp: , Rfl:  ?  sodium chloride  (OCEAN) 0.65 % nasal spray, Place 1 spray into the nose daily as needed for congestion., Disp: , Rfl:  ?  testosterone (ANDROGEL) 50 MG/5GM GEL, Place 5 g onto the skin daily., Disp: , Rfl:  ?  tiZANidine (ZANAFLEX) 4 MG tablet, Take 4 mg by mouth every 6 (six) hours as needed for muscle spasms., Disp: , Rfl:  ?  traMADol (ULTRAM) 50 MG tablet, Take 50 mg by mouth every 6 (six) hours as needed for moderate pain. Reported on 06/23/2015, Disp: , Rfl:  ?  Magnesium Citrate 200 MG TABS, Take 1 tablet by mouth daily., Disp: , Rfl:  ?  metoprolol succinate (TOPROL-XL) 50 MG 24 hr tablet, Take 1 tablet (50 mg total) by mouth daily. Take with or immediately following a meal., Disp: 90 tablet, Rfl: 3 ?  rivaroxaban (XARELTO) 20 MG TABS tablet, Take 1 tablet (20 mg total) by mouth daily with supper., Disp: 90 tablet, Rfl: 3 ?  rosuvastatin (CRESTOR) 5 MG tablet, Take 1 tablet (5 mg total) by mouth daily., Disp: 90 tablet, Rfl: 3 ?  sacubitril-valsartan (ENTRESTO) 49-51 MG, Take 1 tablet by mouth 2 (two) times daily., Disp: 180 tablet, Rfl: 3 ? ?Laboratory examination:  ? ? ?  Latest Ref Rng & Units 04/05/2021  ? 10:21 AM 06/23/2015  ? 10:36 AM 09/08/2011  ?  2:20 PM  ?CMP  ?Glucose 70 - 99 mg/dL 91   107   102    ?BUN 8 - 27 mg/dL $Remove'17   14   22    'khxCoWI$ ?Creatinine 0.76 - 1.27 mg/dL 1.29   1.27   1.09    ?Sodium 134 - 144 mmol/L 142   139   136    ?Potassium 3.5 - 5.2 mmol/L 4.2   4.2   3.5    ?Chloride 96 - 106 mmol/L 108   109   102    ?CO2 20 - 29 mmol/L $RemoveB'17   21   22    'KNSrLXTk$ ?Calcium 8.6 - 10.2 mg/dL 9.6   9.5   9.1    ?Total Protein 6.0 - 8.3 g/dL   6.5    ?Total Bilirubin 0.3 - 1.2 mg/dL   1.2    ?Alkaline Phos 39 - 117 U/L   65    ?AST 0 - 37 U/L   47    ?ALT 0 - 53 U/L   29    ? ?External labs:  ? ?Labs 05/05/2021: ? ?Hb 15.9/HCT 48.8, platelets 241. ? ?Serum glucose 87 mg, BUN 23, creatinine 1.54, EGFR 49 mL, potassium 4.2. ? ?Total cholesterol 159, triglycerides 128, HDL 41,  LDL 95. ? ?Labs 03/17/2021: ? ?BUN 19, creatinine 1.43, EGFR 54  mL, potassium 4.1. ? ?TSH and free T4 normal. ? ?Labs 02/26/2021: ? ?Hb 17.5/HCT 51.0, platelets 163.  Normal indicis. ? ?Serum glucose 127, BUN 26, creatinine 1.5, EGFR 59 mL.  LFTs normal. ? ?Lipid profile 05/05/2020: Total cholesterol 144, triglycerides 94, HDL 58, LDL 69. ? ?Radiology:  ? ?Chest x-ray single view 02/26/2021: ?Cardiomegaly present.  No focal infiltrate or consolidation.  No CHF ? ?CT angiogram of the chest and abdomen 02/26/2021: ?No pulmonary edema. ?Normal thoracic aorta.  There is small pericardial effusion.  Otherwise no significant abnormality.  Normal abdominal aorta. ? ?Cardiac Studies:  ?Cardiac Catheterization 02/28/2021:  ? ?Right heart catheterization findings: ?  ?1- Mean right atrial pressure was 16 mmHg.   ?2- RV systolic pressure was 16/10 mmHg  ?3- Pulmonary artery pressure was 38/23 mmHg with mean pulmonary pressure of 25 mmHg. ?4- Pulmonary capillary wedge pressure was 27 mmHg. ?5- Pulmonary artery saturation was 67.1%, right atrial saturation 70.9%, and RV saturation 67.6% and aortic saturation was measured at 96.5%.   ?6- Cardiac output by Fick method was measured at 5.23 L/min with cardiac index of 2.05 L/min/m?. ?  ?Angiographic findings: ?LM normal ?RI ostial 65% stenosis.  Small vessel. ?LAD is small to normal.  D1 has ostial 75% stenosis, small vessel. ?LCx is large vessel, small to normal. ?RCA: Large and dominant.  Normal. ? ?PCV ECHOCARDIOGRAM COMPLETE 05/24/2021  ?Left ventricle cavity is normal in size. Moderate concentric hypertrophy of the left ventricle. Normal global wall motion. Normal LV systolic function with EF 57%. Normal diastolic filling pattern. ?Trileaflet aortic valve with moderate aortic valve leaflet calcification. Mild aortic stenosis. Vmax 2.0 m/sec, mean PG 10 mmHg, AVA 1.8 cm? by continuity equation. No aortic regurgitation. ?Mild (Grade I) mitral regurgitation. ?No evidence of pulmonary hypertension. ?No significant change compared to previous study in  2017. However compared to external echocardiogram done 02/19/2021, EF has improved from 20 to 30%. ?   ? ?EKG:  ?EKG 06/04/2021: Normal sinus rhythm at the rate of 58 bpm, normal axis, right bundle branch bl

## 2021-06-06 ENCOUNTER — Encounter: Payer: Self-pay | Admitting: Cardiology

## 2021-06-07 ENCOUNTER — Other Ambulatory Visit: Payer: Self-pay

## 2021-06-07 DIAGNOSIS — I5023 Acute on chronic systolic (congestive) heart failure: Secondary | ICD-10-CM

## 2021-06-07 DIAGNOSIS — I5022 Chronic systolic (congestive) heart failure: Secondary | ICD-10-CM

## 2021-06-07 DIAGNOSIS — I2510 Atherosclerotic heart disease of native coronary artery without angina pectoris: Secondary | ICD-10-CM

## 2021-06-07 DIAGNOSIS — I480 Paroxysmal atrial fibrillation: Secondary | ICD-10-CM

## 2021-06-07 MED ORDER — METOPROLOL SUCCINATE ER 50 MG PO TB24: 50.0000 mg | ORAL_TABLET | Freq: Every day | ORAL | 3 refills | Status: DC

## 2021-06-07 MED ORDER — EMPAGLIFLOZIN 10 MG PO TABS: 10.0000 mg | ORAL_TABLET | Freq: Every day | ORAL | 1 refills | Status: DC

## 2021-06-07 MED ORDER — RIVAROXABAN 20 MG PO TABS: 20.0000 mg | ORAL_TABLET | Freq: Every day | ORAL | 3 refills | Status: DC

## 2021-06-07 MED ORDER — ROSUVASTATIN CALCIUM 5 MG PO TABS: 5.0000 mg | ORAL_TABLET | Freq: Every day | ORAL | 3 refills | Status: DC

## 2021-06-07 MED ORDER — ENTRESTO 49-51 MG PO TABS: 1.0000 | ORAL_TABLET | Freq: Two times a day (BID) | ORAL | 3 refills | Status: DC

## 2021-08-12 ENCOUNTER — Encounter: Payer: Self-pay | Admitting: Cardiology

## 2021-08-18 ENCOUNTER — Encounter: Payer: Self-pay | Admitting: Cardiology

## 2021-08-18 NOTE — Telephone Encounter (Signed)
From patient.

## 2021-08-19 NOTE — Telephone Encounter (Signed)
From patient.

## 2021-09-03 ENCOUNTER — Ambulatory Visit: Payer: Medicare Other | Admitting: Cardiology

## 2021-09-03 ENCOUNTER — Encounter: Payer: Self-pay | Admitting: Cardiology

## 2021-09-03 VITALS — BP 125/76 | HR 65 | Temp 97.2°F | Resp 16 | Ht 72.0 in | Wt 275.8 lb

## 2021-09-03 DIAGNOSIS — N1831 Chronic kidney disease, stage 3a: Secondary | ICD-10-CM

## 2021-09-03 DIAGNOSIS — I10 Essential (primary) hypertension: Secondary | ICD-10-CM

## 2021-09-03 DIAGNOSIS — I428 Other cardiomyopathies: Secondary | ICD-10-CM

## 2021-09-03 DIAGNOSIS — I480 Paroxysmal atrial fibrillation: Secondary | ICD-10-CM

## 2021-09-03 NOTE — Progress Notes (Signed)
Primary Physician/Referring:  Margarito Courser, MD  Patient ID: Douglas Khan, male    DOB: 18-Dec-1955, 66 y.o.   MRN: [id number]  Chief Complaint  Patient presents with   Atrial Fibrillation    With EKG   Hypertension   Follow-up    3 months    HPI:    Douglas Khan  is a 66 y.o. Caucasian male patient with hypertension, hyperlipidemia, idiopathic peripheral neuropathy, chronic stage IIIa kidney disease, OSA and CPAP admitted to [facility] on 02/26/2021 with fever, nausea and generalized weakness and also A-fib with RVR and acute pulmonary edema, underwent cardiac catheterization on 02/28/2021 revealing nonischemic cardiomyopathy with severe LV systolic dysfunction.  He also underwent direct-current cardioversion on 03/05/2021 and was started on amiodarone.  He had a 11-day stay.  He presents for 67-monthoffice visit, he had discontinued metoprolol succinate due to very low blood pressure and dizziness.  He has remained asymptomatic since discontinue metoprolol about 3 to 4 weeks ago.  No leg edema, no dyspnea on exertion, no palpitations.  Past Medical History:  Diagnosis Date   CHF (congestive heart failure) (HCC)    Hyperlipidemia    Hypertension    Narcolepsy    Peripheral neuropathy    BOTH FEET   Sleep apnea    USES C-PAP   Social History   Tobacco Use   Smoking status: Never   Smokeless tobacco: Not on file  Substance Use Topics   Alcohol use: Yes    Comment: OCCASIONAL   Marital Status: Married  ROS  Review of Systems  Cardiovascular:  Negative for chest pain, dyspnea on exertion and leg swelling.   Objective  Blood pressure 125/76, pulse 65, temperature (!) 97.2 F (36.2 C), temperature source Temporal, resp. rate 16, height 6' (1.829 m), weight 275 lb 12.8 oz (125.1 kg), SpO2 95 %. Body mass index is 37.41 kg/m.     09/03/2021    9:54 AM 06/04/2021    9:53 AM 04/15/2021    3:09 PM  Vitals with BMI  Height _0  _1  _2   Weight 275  lbs 13 oz 278 lbs 280 lbs 10 oz  BMI 37.4 303.2312.24 Systolic [id number] Diastolic 76 75 71  Pulse 65 60 57     Physical Exam Constitutional:      Appearance: He is morbidly obese.  Neck:     Vascular: No JVD.  Cardiovascular:     Rate and Rhythm: Normal rate and regular rhythm.     Pulses: Intact distal pulses.     Heart sounds: Normal heart sounds. No murmur heard.    No gallop.  Pulmonary:     Effort: Pulmonary effort is normal.     Breath sounds: Normal breath sounds.  Abdominal:     General: Bowel sounds are normal.     Palpations: Abdomen is soft.  Musculoskeletal:     Right lower leg: Edema (trace) present.     Left lower leg: No edema.     Medications and allergies   Allergies  Allergen Reactions   Amoxicillin-Pot Clavulanate Nausea And Vomiting    Other reaction(s): Other GI issues with diarrhea and nausea    Dust Mite Extract     Other reaction(s): Other (See Comments) Congestion/sinusitis   Molds & Smuts     Other reaction(s): Other (See Comments) Congestion sinusitis   Pollen Extract     Other reaction(s): Other Congestion/sinusitis Congestion/sinusitis  Medication list after today's encounter   Current Outpatient Medications:    Calcium Citrate-Vitamin D3 315-6.25 MG-MCG TABS, Take 1 tablet by mouth daily., Disp: , Rfl:    Cholecalciferol (VITAMIN D3) 5000 units CAPS, Take 1 capsule by mouth daily., Disp: , Rfl:    fexofenadine (ALLEGRA) 180 MG tablet, Take 180 mg by mouth daily., Disp: , Rfl:    fluticasone (FLONASE) 50 MCG/ACT nasal spray, Place 1 spray into both nostrils daily., Disp: , Rfl:    Lipoic Acid 150 MG CAPS, Take 300 mg by mouth 2 (two) times daily., Disp: , Rfl:    Magnesium Citrate 200 MG TABS, Take 1 tablet by mouth daily., Disp: , Rfl:    pregabalin (LYRICA) 100 MG capsule, Take 100 mg by mouth daily., Disp: , Rfl:    Probiotic Product (PROBIOTIC DAILY PO), Take 1 tablet by mouth daily., Disp: , Rfl:    rivaroxaban (XARELTO)  20 MG TABS tablet, Take 1 tablet (20 mg total) by mouth daily with supper., Disp: 90 tablet, Rfl: 3   rosuvastatin (CRESTOR) 5 MG tablet, Take 1 tablet (5 mg total) by mouth daily., Disp: 90 tablet, Rfl: 3   sacubitril-valsartan (ENTRESTO) 49-51 MG, Take 1 tablet by mouth 2 (two) times daily., Disp: 180 tablet, Rfl: 3   Saw Palmetto, Serenoa repens, 320 MG CAPS, Take 320 mg by mouth daily., Disp: , Rfl:    sodium chloride (OCEAN) 0.65 % nasal spray, Place 1 spray into the nose daily as needed for congestion., Disp: , Rfl:    testosterone (ANDROGEL) 50 MG/5GM GEL, Place 5 g onto the skin daily., Disp: , Rfl:    tiZANidine (ZANAFLEX) 4 MG tablet, Take 4 mg by mouth every 6 (six) hours as needed for muscle spasms., Disp: , Rfl:    traMADol (ULTRAM) 50 MG tablet, Take 50 mg by mouth every 6 (six) hours as needed for moderate pain. Reported on 06/23/2015, Disp: , Rfl:    metoprolol succinate (TOPROL-XL) 50 MG 24 hr tablet, Take 1 tablet (50 mg total) by mouth every evening. Take with or immediately following a meal., Disp: 90 tablet, Rfl: 3  Laboratory examination:   Lab Results  Component Value Date   NA 142 04/05/2021   K 4.2 04/05/2021   CO2 17 (L) 04/05/2021   GLUCOSE 91 04/05/2021   BUN 17 04/05/2021   CREATININE 1.29 (H) 04/05/2021   CALCIUM 9.6 04/05/2021   EGFR 61 04/05/2021   GFRNONAA 59 (L) 06/23/2015       Latest Ref Rng & Units 04/05/2021   10:21 AM 06/23/2015   10:36 AM 09/08/2011    2:20 PM  CMP  Glucose 70 - 99 mg/dL 91  107  102   BUN 8 - 27 mg/dL _0 Creatinine 0.76 - 1.27 mg/dL 1.29  1.27  1.09   Sodium 134 - 144 mmol/L 142  139  136   Potassium 3.5 - 5.2 mmol/L 4.2  4.2  3.5   Chloride 96 - 106 mmol/L 108  109  102   CO2 20 - 29 mmol/L _1 Calcium 8.6 - 10.2 mg/dL 9.6  9.5  9.1   Total Protein 6.0 - 8.3 g/dL   6.5   Total Bilirubin 0.3 - 1.2 mg/dL   1.2   Alkaline Phos 39 - 117 U/L   65   AST 0 - 37 U/L   47   ALT 0 - 53 U/L  29    External  labs:   Labs 05/05/2021:  Hb 15.9/HCT 48.8, platelets 241.  Serum glucose 87 mg, BUN 23, creatinine 1.54, EGFR 49 mL, potassium 4.2.  Total cholesterol 159, triglycerides 128, HDL 41, LDL 95.  Labs 03/17/2021:  BUN 19, creatinine 1.43, EGFR 54 mL, potassium 4.1.  TSH and free T4 normal.  Labs 02/26/2021:  Hb 17.5/HCT 51.0, platelets 163.  Normal indicis.  Serum glucose 127, BUN 26, creatinine 1.5, EGFR 59 mL.  LFTs normal.  Lipid profile 05/05/2020: Total cholesterol 144, triglycerides 94, HDL 58, LDL 69.  Radiology:   Chest x-ray single view 02/26/2021: Cardiomegaly present.  No focal infiltrate or consolidation.  No CHF  CT angiogram of the chest and abdomen 02/26/2021: No pulmonary edema. Normal thoracic aorta.  There is small pericardial effusion.  Otherwise no significant abnormality.  Normal abdominal aorta.  Cardiac Studies:  Cardiac Catheterization 02/28/2021:   Right heart catheterization findings:   1- Mean right atrial pressure was 16 mmHg.   2- RV systolic pressure was 56/43 mmHg  3- Pulmonary artery pressure was 38/23 mmHg with mean pulmonary pressure of 25 mmHg. 4- Pulmonary capillary wedge pressure was 27 mmHg. 5- Pulmonary artery saturation was 67.1%, right atrial saturation 70.9%, and RV saturation 67.6% and aortic saturation was measured at 96.5%.   6- Cardiac output by Fick method was measured at 5.23 L/min with cardiac index of 2.05 L/min/m.   Angiographic findings: LM normal RI ostial 65% stenosis.  Small vessel. LAD is small to normal.  D1 has ostial 75% stenosis, small vessel. LCx is large vessel, small to normal. RCA: Large and dominant.  Normal.  PCV ECHOCARDIOGRAM COMPLETE 05/24/2021  Left ventricle cavity is normal in size. Moderate concentric hypertrophy of the left ventricle. Normal global wall motion. Normal LV systolic function with EF 57%. Normal diastolic filling pattern. Trileaflet aortic valve with moderate aortic valve leaflet  calcification. Mild aortic stenosis. Vmax 2.0 m/sec, mean PG 10 mmHg, AVA 1.8 cm by continuity equation. No aortic regurgitation. Mild (Grade I) mitral regurgitation. No evidence of pulmonary hypertension. No significant change compared to previous study in 2017. However compared to external echocardiogram done 02/19/2021, EF has improved from 20 to 30%.     EKG:   EKG 09/03/2021: Normal sinus rhythm at that of 59 bpm, normal axis, right bundle branch block nonspecific T abnormality.  Normal QT interval.  No significant change from 06/04/2021.   Assessment     ICD-10-CM   1. Paroxysmal atrial fibrillation (HCC)  I48.0 EKG 12-Lead    metoprolol succinate (TOPROL-XL) 50 MG 24 hr tablet    2. Non-ischemic cardiomyopathy (HCC)  I42.8 metoprolol succinate (TOPROL-XL) 50 MG 24 hr tablet    3. Primary hypertension  I10     4. Stage 3a chronic kidney disease (HCC)  N18.31       CHA2DS2-VASc Score is 4.  Yearly risk of stroke: 4.8% (A, HTN, CHF and CAD).  Score of 1=0.6; 2=2.2; 3=3.2; 4=4.8; 5=7.2; 6=9.8; 7=>9.8) -(CHF; HTN; vasc disease DM,  Male = 1; Age <65 =0; 65-74 = 1,  >75 =2; stroke/embolism= 2).    Medications Discontinued During This Encounter  Medication Reason   metoprolol succinate (TOPROL-XL) 50 MG 24 hr tablet Patient Preference   empagliflozin (JARDIANCE) 10 MG TABS tablet Dose change     No orders of the defined types were placed in this encounter.  Orders Placed This Encounter  Procedures   EKG 12-Lead   Recommendations:   Douglas Khan  Douglas Khan is a 66 y.o.  Caucasian male patient with hypertension, hyperlipidemia, idiopathic peripheral neuropathy, chronic stage IIIa kidney disease, OSA and CPAP admitted to [facility] on 02/26/2021 with fever, nausea and generalized weakness and also A-fib with RVR and acute pulmonary edema, underwent cardiac catheterization on 02/28/2021 revealing nonischemic cardiomyopathy with severe LV systolic dysfunction.  He also  underwent direct-current cardioversion on 03/05/2021 and was started on amiodarone.  He had a 11-day stay.  On his last office visit 3 months ago, amiodarone was discontinued and he had maintained sinus rhythm.  He is discontinuing metoprolol due to low blood pressure and mild dizziness.  I will discontinue Jardiance as he is not in any further episodes of heart failure and his LVEF has improved.  I would like him to be on metoprolol in view of cardiomyopathy and also atrial fibrillation.  He has been compliant with CPAP.  Weight loss again discussed with the patient and encouraged him to continue to be strict with his diet, he has been gradually losing weight.  If he has recurrence of atrial fibrillation I would have a low threshold for sending him for atrial fibrillation ablation in view of development of cardiomyopathy.  Office visit in 6 months.    Adrian Prows, MD, [facility] 09/03/2021, 10:32 AM Office: [phone]

## 2021-10-01 ENCOUNTER — Encounter: Payer: Self-pay | Admitting: Cardiology

## 2022-03-11 ENCOUNTER — Ambulatory Visit: Payer: Medicare Other | Admitting: Cardiology

## 2022-03-17 ENCOUNTER — Ambulatory Visit: Payer: Medicare Other | Admitting: Cardiology

## 2022-03-29 ENCOUNTER — Ambulatory Visit: Payer: Medicare Other | Admitting: Cardiology

## 2022-03-29 ENCOUNTER — Encounter: Payer: Self-pay | Admitting: Cardiology

## 2022-03-29 VITALS — BP 130/73 | HR 61 | Resp 16 | Ht 72.0 in | Wt 298.0 lb

## 2022-03-29 DIAGNOSIS — I10 Essential (primary) hypertension: Secondary | ICD-10-CM

## 2022-03-29 DIAGNOSIS — I480 Paroxysmal atrial fibrillation: Secondary | ICD-10-CM

## 2022-03-29 DIAGNOSIS — I428 Other cardiomyopathies: Secondary | ICD-10-CM

## 2022-03-29 NOTE — Progress Notes (Unsigned)
Primary Physician/Referring:  Margarito Courser, MD  Patient ID: Douglas Khan, male    DOB: [birth year], 67 y.o.   MRN: [id number]  Chief Complaint  Patient presents with   Atrial Fibrillation   Hypertension   Cardiomyopathy   Follow-up    6 month    HPI:    Douglas Khan  is a 67 y.o. Caucasian male patient with hypertension, hyperlipidemia, idiopathic peripheral neuropathy, chronic stage IIIa kidney disease, OSA and CPAP admitted to [facility] on 02/26/2021 with fever, nausea and generalized weakness and also A-fib with RVR and acute pulmonary edema, underwent cardiac catheterization on 02/28/2021 revealing nonischemic cardiomyopathy with severe LV systolic dysfunction.  He also underwent direct-current cardioversion on 03/05/2021 and was started on amiodarone.  He had a 11-day stay.  He presents for 25-monthoffice visit. He has remained asymptomatic. No leg edema, no dyspnea on exertion, no palpitations.  Past Medical History:  Diagnosis Date   CHF (congestive heart failure) (HCC)    Hyperlipidemia    Hypertension    Narcolepsy    Peripheral neuropathy    BOTH FEET   Sleep apnea    USES C-PAP   Social History   Tobacco Use   Smoking status: Never   Smokeless tobacco: Not on file  Substance Use Topics   Alcohol use: Yes    Comment: OCCASIONAL   Marital Status: Married  ROS  Review of Systems  Cardiovascular:  Negative for chest pain, dyspnea on exertion and leg swelling.   Objective  Blood pressure 130/73, pulse 61, resp. rate 16, height 6' (1.829 m), weight 298 lb (135.2 kg), SpO2 95 %. Body mass index is 40.42 kg/m.     03/29/2022    3:21 PM 09/03/2021    9:54 AM 06/04/2021    9:53 AM  Vitals with BMI  Height '6\' 0"'$  '6\' 0"'$  '6\' 0"'$   Weight 298 lbs 275 lbs 13 oz 278 lbs  BMI 40.41 [id number] Systolic [id number] Diastolic 73 76 75  Pulse 61 65 60     Physical Exam Constitutional:      Appearance: He is morbidly obese.  Neck:     Vascular:  No JVD.  Cardiovascular:     Rate and Rhythm: Normal rate and regular rhythm.     Pulses: Intact distal pulses.     Heart sounds: Normal heart sounds. No murmur heard.    No gallop.  Pulmonary:     Effort: Pulmonary effort is normal.     Breath sounds: Normal breath sounds.  Abdominal:     General: Bowel sounds are normal.     Palpations: Abdomen is soft.  Musculoskeletal:     Right lower leg: Edema (trace) present.     Left lower leg: No edema.     Medications and allergies   Allergies  Allergen Reactions   Amoxicillin-Pot Clavulanate Nausea And Vomiting    Other reaction(s): Other GI issues with diarrhea and nausea    Dust Mite Extract     Other reaction(s): Other (See Comments) Congestion/sinusitis   Molds & Smuts     Other reaction(s): Other (See Comments) Congestion sinusitis   Pollen Extract     Other reaction(s): Other Congestion/sinusitis Congestion/sinusitis    Medication list after today's encounter   Current Outpatient Medications:    Calcium Citrate-Vitamin D3 315-6.25 MG-MCG TABS, Take 1 tablet by mouth daily., Disp: , Rfl:    fexofenadine (ALLEGRA) 180 MG tablet, Take 180 mg  by mouth daily., Disp: , Rfl:    fluticasone (FLONASE) 50 MCG/ACT nasal spray, Place 1 spray into both nostrils daily., Disp: , Rfl:    Lipoic Acid 150 MG CAPS, Take 300 mg by mouth 2 (two) times daily., Disp: , Rfl:    Magnesium Citrate 200 MG TABS, Take 1 tablet by mouth daily., Disp: , Rfl:    metoprolol succinate (TOPROL-XL) 50 MG 24 hr tablet, Take 1 tablet (50 mg total) by mouth every evening. Take with or immediately following a meal., Disp: 90 tablet, Rfl: 3   pregabalin (LYRICA) 100 MG capsule, Take 100 mg by mouth daily., Disp: , Rfl:    Probiotic Product (PROBIOTIC DAILY PO), Take 1 tablet by mouth daily., Disp: , Rfl:    rivaroxaban (XARELTO) 20 MG TABS tablet, Take 1 tablet (20 mg total) by mouth daily with supper., Disp: 90 tablet, Rfl: 3   rosuvastatin (CRESTOR) 5 MG  tablet, Take 1 tablet (5 mg total) by mouth daily., Disp: 90 tablet, Rfl: 3   sacubitril-valsartan (ENTRESTO) 49-51 MG, Take 1 tablet by mouth 2 (two) times daily., Disp: 180 tablet, Rfl: 3   Saw Palmetto, Serenoa repens, 320 MG CAPS, Take 320 mg by mouth daily., Disp: , Rfl:    sodium chloride (OCEAN) 0.65 % nasal spray, Place 1 spray into the nose daily as needed for congestion., Disp: , Rfl:    testosterone (ANDROGEL) 50 MG/5GM GEL, Place 5 g onto the skin daily., Disp: , Rfl:    tiZANidine (ZANAFLEX) 4 MG tablet, Take 4 mg by mouth every 6 (six) hours as needed for muscle spasms., Disp: , Rfl:    traMADol (ULTRAM) 50 MG tablet, Take 50 mg by mouth every 6 (six) hours as needed for moderate pain. Reported on 06/23/2015, Disp: , Rfl:   Laboratory examination:   Lab Results  Component Value Date   NA 142 04/05/2021   K 4.2 04/05/2021   CO2 17 (L) 04/05/2021   GLUCOSE 91 04/05/2021   BUN 17 04/05/2021   CREATININE 1.29 (H) 04/05/2021   CALCIUM 9.6 04/05/2021   EGFR 61 04/05/2021   GFRNONAA 59 (L) 06/23/2015       Latest Ref Rng & Units 04/05/2021   10:21 AM 06/23/2015   10:36 AM 09/08/2011    2:20 PM  CMP  Glucose 70 - 99 mg/dL 91  107  102   BUN 8 - 27 mg/dL '17  14  22   '$ Creatinine 0.76 - 1.27 mg/dL 1.29  1.27  1.09   Sodium 134 - 144 mmol/L 142  139  136   Potassium 3.5 - 5.2 mmol/L 4.2  4.2  3.5   Chloride 96 - 106 mmol/L 108  109  102   CO2 20 - 29 mmol/L '17  21  22   '$ Calcium 8.6 - 10.2 mg/dL 9.6  9.5  9.1   Total Protein 6.0 - 8.3 g/dL   6.5   Total Bilirubin 0.3 - 1.2 mg/dL   1.2   Alkaline Phos 39 - 117 U/L   65   AST 0 - 37 U/L   47   ALT 0 - 53 U/L   29    External labs:   Labs 05/05/2021:  Hb 15.9/HCT 48.8, platelets 241.  Serum glucose 87 mg, BUN 23, creatinine 1.54, EGFR 49 mL, potassium 4.2.  Total cholesterol 159, triglycerides 128, HDL 41, LDL 95.  Labs 03/17/2021:  BUN 19, creatinine 1.43, EGFR 54 mL, potassium 4.1.  TSH and  free T4 normal.  Labs  02/26/2021:  Hb 17.5/HCT 51.0, platelets 163.  Normal indicis.  Serum glucose 127, BUN 26, creatinine 1.5, EGFR 59 mL.  LFTs normal.  Lipid profile 05/05/2020: Total cholesterol 144, triglycerides 94, HDL 58, LDL 69.  Radiology:   Chest x-ray single view 02/26/2021: Cardiomegaly present.  No focal infiltrate or consolidation.  No CHF  CT angiogram of the chest and abdomen 02/26/2021: No pulmonary edema. Normal thoracic aorta.  There is small pericardial effusion.  Otherwise no significant abnormality.  Normal abdominal aorta.  Cardiac Studies:  Cardiac Catheterization 02/28/2021:   Right heart catheterization findings:   1- Mean right atrial pressure was 16 mmHg.   2- RV systolic pressure was [id number]  3- Pulmonary artery pressure was 38/23 mmHg with mean pulmonary pressure of 25 mmHg. 4- Pulmonary capillary wedge pressure was 27 mmHg. 5- Pulmonary artery saturation was 67.1%, right atrial saturation 70.9%, and RV saturation 67.6% and aortic saturation was measured at 96.5%.   6- Cardiac output by Fick method was measured at 5.23 L/min with cardiac index of 2.05 L/min/m.   Angiographic findings: LM normal RI ostial 65% stenosis.  Small vessel. LAD is small to normal.  D1 has ostial 75% stenosis, small vessel. LCx is large vessel, small to normal. RCA: Large and dominant.  Normal.  PCV ECHOCARDIOGRAM COMPLETE 05/24/2021  Left ventricle cavity is normal in size. Moderate concentric hypertrophy of the left ventricle. Normal global wall motion. Normal LV systolic function with EF 57%. Normal diastolic filling pattern. Trileaflet aortic valve with moderate aortic valve leaflet calcification. Mild aortic stenosis. Vmax 2.0 m/sec, mean PG 10 mmHg, AVA 1.8 cm by continuity equation. No aortic regurgitation. Mild (Grade I) mitral regurgitation. No evidence of pulmonary hypertension. No significant change compared to previous study in 2017. However compared to external echocardiogram  done 02/19/2021, EF has improved from 20 to 30%.     EKG:   EKG 03/29/2022: Normal sinus rhythm with rate of 57 bpm, leftward axis, right bundle branch block.  T wave abnormality, cannot exclude anteroseptal ischemia.  Compared to 09/03/2021, no significant change..   Assessment     ICD-10-CM   1. Paroxysmal atrial fibrillation (HCC)  I48.0 EKG 12-Lead    2. Non-ischemic cardiomyopathy ([place])  I42.8     3. Primary hypertension  I10       CHA2DS2-VASc Score is 4.  Yearly risk of stroke: 4.8% (A, HTN, CHF and CAD).  Score of 1=0.6; 2=2.2; 3=3.2; 4=4.8; 5=7.2; 6=9.8; 7=>9.8) -(CHF; HTN; vasc disease DM,  Male = 1; Age <65 =0; 65-74 = 1,  >75 =2; stroke/embolism= 2).    Medications Discontinued During This Encounter  Medication Reason   Cholecalciferol (VITAMIN D3) 5000 units CAPS Patient Preference     No orders of the defined types were placed in this encounter.  Orders Placed This Encounter  Procedures   EKG 12-Lead   Recommendations:   Douglas Khan is a 67 y.o.  Caucasian male patient with hypertension, hyperlipidemia, idiopathic peripheral neuropathy, chronic stage IIIa kidney disease, OSA and CPAP admitted to [facility] [place] on 02/26/2021 with fever, nausea and generalized weakness and also A-fib with RVR and acute pulmonary edema, underwent cardiac catheterization on 02/28/2021 revealing nonischemic cardiomyopathy with severe LV systolic dysfunction.  He also underwent direct-current cardioversion on 03/05/2021 and was started on amiodarone.  He had a 11-day stay.  1. Paroxysmal atrial fibrillation [facility]) Patient presenting with septic shock and was found to be in  pulmonary edema and severe cardiomyopathy and also atrial fibrillation while in [place] in January 2023.  I suspect his cardiomyopathy was related to sepsis and/or Takotsubo syndrome, atrial fibrillation induced by acute systolic heart failure.  He has maintained sinus rhythm, he does have sleep  apnea and is very compliant with CPAP.  He is not on any antiarrhythmic drugs.  As his LVEF has normalized, 1 option would be to discontinue Entresto and switch him over to valsartan for the reasons of cost and then closely monitoring his LVEF over the next 6 months to a year, also whether we could discontinue Xarelto as atrial fibrillation was induced by nonischemic cardiomyopathy.  He only has mild to at most moderate coronary disease in small vessels.  Otherwise his LAD, CX, RCA were all widely patent and normal.  2. Non-ischemic cardiomyopathy ([place]) LV systolic dysfunction has now resolved, I would like to repeat his echocardiogram in 6 months.  3. Primary hypertension Blood pressure under excellent control.  Did not make any changes to his medications.  Office visit in 6 months.  On his next office visit, if LVEF is maintained, our options will be to switch him over to valsartan and if he is maintaining sinus rhythm, consider discontinuation of Xarelto.  Shared decision was made today regarding this for now.    Adrian Prows, MD, [facility] 03/29/2022, 4:07 PM Office: [phone]

## 2022-06-02 ENCOUNTER — Other Ambulatory Visit: Payer: Self-pay | Admitting: Cardiology

## 2022-06-02 DIAGNOSIS — I428 Other cardiomyopathies: Secondary | ICD-10-CM

## 2022-06-02 DIAGNOSIS — I5022 Chronic systolic (congestive) heart failure: Secondary | ICD-10-CM

## 2022-06-02 DIAGNOSIS — I480 Paroxysmal atrial fibrillation: Secondary | ICD-10-CM

## 2022-06-02 DIAGNOSIS — I2510 Atherosclerotic heart disease of native coronary artery without angina pectoris: Secondary | ICD-10-CM

## 2022-09-13 ENCOUNTER — Other Ambulatory Visit: Payer: Medicare Other

## 2022-09-14 ENCOUNTER — Ambulatory Visit: Payer: Medicare Other

## 2022-09-14 DIAGNOSIS — I480 Paroxysmal atrial fibrillation: Secondary | ICD-10-CM

## 2022-09-14 DIAGNOSIS — I428 Other cardiomyopathies: Secondary | ICD-10-CM

## 2022-09-17 NOTE — Progress Notes (Signed)
Echocardiogram 09/14/2022: Left ventricle cavity is normal in size. Mild concentric hypertrophy of the left ventricle. Normal global wall motion. Normal LV systolic function with EF 53%. Indeterminate diastolic filling pattern.  Left atrial cavity is mildly dilated at 35.6 ml/m^2. Trileaflet aortic valve with mild aortic valve leaflet calcification. Mild aortic stenosis. Vmax 2.5 m/sec, mean PG 11 mmHg, AVA 1.9 cm by continuity equation. No regurgitation. Mild (Grade I) mitral regurgitation. Structurally normal tricuspid valve with trace regurgitation. Estimated pulmonary artery systolic pressure 31 mmHg.  Previous study on 05/24/2021 reported normal diastolic function.

## 2022-09-20 ENCOUNTER — Encounter: Payer: Self-pay | Admitting: Cardiology

## 2022-09-20 ENCOUNTER — Ambulatory Visit: Payer: Medicare Other | Admitting: Cardiology

## 2022-09-20 VITALS — BP 133/75 | HR 55 | Resp 16 | Ht 72.0 in | Wt 292.2 lb

## 2022-09-20 DIAGNOSIS — I10 Essential (primary) hypertension: Secondary | ICD-10-CM

## 2022-09-20 DIAGNOSIS — I428 Other cardiomyopathies: Secondary | ICD-10-CM

## 2022-09-20 DIAGNOSIS — I480 Paroxysmal atrial fibrillation: Secondary | ICD-10-CM

## 2022-09-20 MED ORDER — ENTRESTO 97-103 MG PO TABS: 1.0000 | ORAL_TABLET | Freq: Two times a day (BID) | ORAL | 3 refills | Status: DC

## 2022-09-20 NOTE — Progress Notes (Signed)
Primary Physician/Referring:  Wilfred Curtis, MD  Patient ID: ASTIN HANKINSON, male    DOB: [birth year], 67 y.o.   MRN: [id number]  Chief Complaint  Patient presents with   Cardiomyopathy   Hypertension   Paroxysmal atrial fibrillation    Follow-up    6 months    HPI:    DECATUR SEGREST  is a 67 y.o. Caucasian male patient with hypertension, hyperlipidemia, idiopathic peripheral neuropathy, chronic stage IIIa kidney disease, OSA and CPAP admitted to medical [facility] on 02/26/2021 with fever, nausea and generalized weakness and also A-fib with RVR and acute pulmonary edema, underwent cardiac catheterization on 02/28/2021 revealing nonischemic cardiomyopathy with severe LV systolic dysfunction.  He also underwent direct-current cardioversion on 03/05/2021 and was started on amiodarone.  He had a 11-day stay.  He presents for 62-month office visit. He has remained asymptomatic. No leg edema, no dyspnea on exertion, no palpitations.  He has developed psoriatic rash and was advised by his dermatologist about the side effects of beta-blocker.  He is wondering if he can come off of this.  Otherwise no other complaints.  Past Medical History:  Diagnosis Date   CHF (congestive heart failure) (HCC)    Hyperlipidemia    Hypertension    Narcolepsy    Peripheral neuropathy    BOTH FEET   Sleep apnea    USES C-PAP   Social History   Tobacco Use   Smoking status: Never   Smokeless tobacco: Never  Substance Use Topics   Alcohol use: Yes    Comment: OCCASIONAL   Marital Status: Married  ROS  Review of Systems  Cardiovascular:  Negative for chest pain, dyspnea on exertion and leg swelling.   Objective  Blood pressure 133/75, pulse (!) 55, resp. rate 16, height 6' (1.829 m), weight 292 lb 3.2 oz (132.5 kg), SpO2 94%. Body mass index is 39.63 kg/m.     09/20/2022   10:17 AM 03/29/2022    3:21 PM 09/03/2021    9:54 AM  Vitals with BMI  Height 6\' 0"  6\' 0"  6\' 0"   Weight 292 lbs  3 oz 298 lbs 275 lbs 13 oz  BMI 39.62 40.41 37.4  Systolic 133 130 962  Diastolic 75 73 76  Pulse 55 61 65     Physical Exam Constitutional:      Appearance: He is morbidly obese.  Neck:     Vascular: No JVD.  Cardiovascular:     Rate and Rhythm: Normal rate and regular rhythm.     Pulses: Intact distal pulses.     Heart sounds: Normal heart sounds. No murmur heard.    No gallop.  Pulmonary:     Effort: Pulmonary effort is normal.     Breath sounds: Normal breath sounds.  Abdominal:     General: Bowel sounds are normal.     Palpations: Abdomen is soft.  Musculoskeletal:     Right lower leg: Edema (trace) present.     Left lower leg: No edema.     Laboratory examination:    External labs:   Labs 05/17/2022:  Serum glucose 94 mg, BUN 17, creatinine 1.27, EGFR 62 mL, LFTs normal.  A1c 5.4%.  Total cholesterol 166, triglycerides 120, HDL 61, LDL 84.  Radiology:   Chest x-ray single view 02/26/2021: Cardiomegaly present.  No focal infiltrate or consolidation.  No CHF  CT angiogram of the chest and abdomen 02/26/2021: No pulmonary edema. Normal thoracic aorta.  There is small pericardial  effusion.  Otherwise no significant abnormality.  Normal abdominal aorta.  Cardiac Studies:  Cardiac Catheterization 02/28/2021:   Right heart catheterization findings:   1- Mean right atrial pressure was 16 mmHg.   2- RV systolic pressure was 33/19 mmHg  3- Pulmonary artery pressure was 38/23 mmHg with mean pulmonary pressure of 25 mmHg. 4- Pulmonary capillary wedge pressure was 27 mmHg. 5- Pulmonary artery saturation was 67.1%, right atrial saturation 70.9%, and RV saturation 67.6% and aortic saturation was measured at 96.5%.   6- Cardiac output by Fick method was measured at 5.23 L/min with cardiac index of 2.05 L/min/m.   Angiographic findings: LM normal RI ostial 65% stenosis.  Small vessel. LAD is small to normal.  D1 has ostial 75% stenosis, small vessel. LCx is large  vessel, small to normal. RCA: Large and dominant.  Normal.  Echocardiogram 09/14/2022: Left ventricle cavity is normal in size. Mild concentric hypertrophy of the left ventricle. Normal global wall motion. Normal LV systolic function with EF 53%. Indeterminate diastolic filling pattern.  Left atrial cavity is mildly dilated at 35.6 ml/m^2. Trileaflet aortic valve with mild aortic valve leaflet calcification. Mild aortic stenosis. Vmax 2.5 m/sec, mean PG 11 mmHg, AVA 1.9 cm by continuity equation. No regurgitation. Mild (Grade I) mitral regurgitation. Structurally normal tricuspid valve with trace regurgitation. Estimated pulmonary artery systolic pressure 31 mmHg.  Previous study on 05/24/2021 reported normal diastolic function.  Compared to external echocardiogram done 02/19/2021, EF has improved from 20 to 30%.     Medications and allergies   Allergies  Allergen Reactions   Metoprolol Dermatitis   Amoxicillin-Pot Clavulanate Nausea And Vomiting    Other reaction(s): Other GI issues with diarrhea and nausea    Dust Mite Extract     Other reaction(s): Other (See Comments) Congestion/sinusitis   Molds & Smuts     Other reaction(s): Other (See Comments) Congestion sinusitis   Pollen Extract     Other reaction(s): Other Congestion/sinusitis Congestion/sinusitis    Current Outpatient Medications:    Calcium Citrate-Vitamin D3 315-6.25 MG-MCG TABS, Take 1 tablet by mouth daily., Disp: , Rfl:    fexofenadine (ALLEGRA) 180 MG tablet, Take 180 mg by mouth daily., Disp: , Rfl:    fluticasone (FLONASE) 50 MCG/ACT nasal spray, Place 1 spray into both nostrils daily., Disp: , Rfl:    Lipoic Acid 150 MG CAPS, Take 300 mg by mouth 2 (two) times daily., Disp: , Rfl:    Magnesium Citrate 200 MG TABS, Take 1 tablet by mouth daily., Disp: , Rfl:    pregabalin (LYRICA) 100 MG capsule, Take 100 mg by mouth daily., Disp: , Rfl:    Probiotic Product (PROBIOTIC DAILY PO), Take 1 tablet by mouth  daily., Disp: , Rfl:    rosuvastatin (CRESTOR) 5 MG tablet, TAKE 1 TABLET (5 MG TOTAL) BY MOUTH DAILY., Disp: 90 tablet, Rfl: 3   sacubitril-valsartan (ENTRESTO) 97-103 MG, Take 1 tablet by mouth 2 (two) times daily., Disp: 180 tablet, Rfl: 3   Saw Palmetto, Serenoa repens, 320 MG CAPS, Take 320 mg by mouth daily., Disp: , Rfl:    sodium chloride (OCEAN) 0.65 % nasal spray, Place 1 spray into the nose daily as needed for congestion., Disp: , Rfl:    testosterone (ANDROGEL) 50 MG/5GM GEL, Place 5 g onto the skin daily., Disp: , Rfl:    tiZANidine (ZANAFLEX) 4 MG tablet, Take 4 mg by mouth every 6 (six) hours as needed for muscle spasms., Disp: , Rfl:    traMADol (  ULTRAM) 50 MG tablet, Take 50 mg by mouth every 6 (six) hours as needed for moderate pain. Reported on 06/23/2015, Disp: , Rfl:    XARELTO 20 MG TABS tablet, TAKE 1 TABLET BY MOUTH DAILY WITH SUPPER., Disp: 90 tablet, Rfl: 3  EKG:   EKG 09/20/2022: Normal sinus rhythm at the rate of 56 bpm, leftward axis, right bundle branch block.  No evidence of ischemia.  Normal QT interval.  No change from 03/29/2022.  Assessment     ICD-10-CM   1. Paroxysmal atrial fibrillation (HCC)  I48.0 EKG 12-Lead    2. Non-ischemic cardiomyopathy (HCC)  I42.8 sacubitril-valsartan (ENTRESTO) 97-103 MG    3. Primary hypertension  I10       CHA2DS2-VASc Score is 4.  Yearly risk of stroke: 4.8% (A, HTN, CHF and CAD).  Score of 1=0.6; 2=2.2; 3=3.2; 4=4.8; 5=7.2; 6=9.8; 7=>9.8) -(CHF; HTN; vasc disease DM,  Male = 1; Age <65 =0; 65-74 = 1,  >75 =2; stroke/embolism= 2).    Medications Discontinued During This Encounter  Medication Reason   metoprolol succinate (TOPROL-XL) 50 MG 24 hr tablet Side effect (s)   ENTRESTO 49-51 MG Dose change    Meds ordered this encounter  Medications   sacubitril-valsartan (ENTRESTO) 97-103 MG    Sig: Take 1 tablet by mouth 2 (two) times daily.    Dispense:  180 tablet    Refill:  3   Orders Placed This Encounter   Procedures   EKG 12-Lead   Recommendations:   TJAY COOPERWOOD is a 67 y.o.  Caucasian male patient with hypertension, hyperlipidemia, idiopathic peripheral neuropathy, chronic stage IIIa kidney disease, OSA and CPAP admitted to medical [facility] on 02/26/2021 with fever, nausea and generalized weakness and also A-fib with RVR and acute pulmonary edema, underwent cardiac catheterization on 02/28/2021 revealing nonischemic cardiomyopathy with severe LV systolic dysfunction.  He also underwent direct-current cardioversion on 03/05/2021 and was started on amiodarone.  He had a 11-day stay.  1. Paroxysmal atrial fibrillation (HCC) Patient has not had any recurrence of atrial fibrillation and is maintaining sinus rhythm.  However in view of his age, morbid obesity, hypertension as risk factors, we have continued anticoagulation although I suspect his A-fib was related to sepsis.  Echocardiogram revealing only mild LA dilatation performed recently and continued preservation of LVEF which is improved from severe LV systolic dysfunction.  Continue Xarelto in lieu of high CHA2DS2-VASc risk score. - EKG 12-Lead  2. Non-ischemic cardiomyopathy (HCC) This is now resolved.  However patient is presently on low-dose beta-blocker and has developed severe rash in the skin and psoriatic lesions.  As his EF has been preserved, and I also think that his cardiomyopathy was probably related to sepsis and apical ballooning syndrome versus A-fib related cardiomyopathy, I will discontinue beta-blockers and increase the dose of Entresto to 97/103 mg for blood pressure control as well.  Patient sensitive to calcium 7 blockers in view of leg edema. - sacubitril-valsartan (ENTRESTO) 97-103 MG; Take 1 tablet by mouth 2 (two) times daily.  Dispense: 180 tablet; Refill: 3  3. Primary hypertension As dictated above, Sherryll Burger has been increased.  He has an appointment to see his PCP in 3 to 4 weeks and is aware of the  importance of getting a BMP.  Otherwise I will see him back in 6 months and if he remains stable on annual basis.    Yates Decamp, MD, [facility] 09/20/2022, 6:26 PM Office: [phone]

## 2022-09-21 ENCOUNTER — Ambulatory Visit: Payer: Medicare Other | Admitting: Cardiology

## 2022-12-28 ENCOUNTER — Encounter: Payer: Self-pay | Admitting: Cardiology

## 2022-12-28 DIAGNOSIS — I428 Other cardiomyopathies: Secondary | ICD-10-CM

## 2022-12-28 DIAGNOSIS — I10 Essential (primary) hypertension: Secondary | ICD-10-CM

## 2022-12-28 DIAGNOSIS — I480 Paroxysmal atrial fibrillation: Secondary | ICD-10-CM

## 2022-12-28 MED ORDER — METOPROLOL SUCCINATE ER 50 MG PO TB24: 50.0000 mg | ORAL_TABLET | Freq: Every day | ORAL | 3 refills | Status: DC

## 2022-12-28 NOTE — Telephone Encounter (Signed)
ICD-10-CM   1. Paroxysmal atrial fibrillation (HCC)  I48.0     2. Primary hypertension  I10 metoprolol succinate (TOPROL-XL) 50 MG 24 hr tablet    3. Non-ischemic cardiomyopathy (HCC)  I42.8 metoprolol succinate (TOPROL-XL) 50 MG 24 hr tablet     Meds ordered this encounter  Medications   metoprolol succinate (TOPROL-XL) 50 MG 24 hr tablet    Sig: Take 1 tablet (50 mg total) by mouth daily. Take with or immediately following a meal.    Dispense:  90 tablet    Refill:  3

## 2023-01-17 ENCOUNTER — Encounter: Payer: Self-pay | Admitting: Cardiology

## 2023-01-17 DIAGNOSIS — I2510 Atherosclerotic heart disease of native coronary artery without angina pectoris: Secondary | ICD-10-CM

## 2023-03-22 ENCOUNTER — Ambulatory Visit: Payer: Medicare Other | Attending: Cardiology | Admitting: Cardiology

## 2023-03-22 ENCOUNTER — Encounter: Payer: Self-pay | Admitting: Cardiology

## 2023-03-22 VITALS — BP 138/76 | HR 70 | Resp 14 | Ht 72.0 in | Wt 306.0 lb

## 2023-03-22 DIAGNOSIS — I480 Paroxysmal atrial fibrillation: Secondary | ICD-10-CM | POA: Diagnosis not present

## 2023-03-22 DIAGNOSIS — I428 Other cardiomyopathies: Secondary | ICD-10-CM

## 2023-03-22 DIAGNOSIS — I350 Nonrheumatic aortic (valve) stenosis: Secondary | ICD-10-CM | POA: Diagnosis not present

## 2023-03-22 DIAGNOSIS — I10 Essential (primary) hypertension: Secondary | ICD-10-CM | POA: Diagnosis not present

## 2023-03-22 DIAGNOSIS — G4733 Obstructive sleep apnea (adult) (pediatric): Secondary | ICD-10-CM

## 2023-03-22 NOTE — Patient Instructions (Signed)
Medication Instructions:  Your physician recommends that you continue on your current medications as directed. Please refer to the Current Medication list given to you today.  *If you need a refill on your cardiac medications before your next appointment, please call your pharmacy*   Lab Work: none If you have labs (blood work) drawn today and your tests are completely normal, you will receive your results only by: MyChart Message (if you have MyChart) OR A paper copy in the mail If you have any lab test that is abnormal or we need to change your treatment, we will call you to review the results.   Testing/Procedures: Your physician has requested that you have an echocardiogram in one year. Echocardiography is a painless test that uses sound waves to create images of your heart. It provides your doctor with information about the size and shape of your heart and how well your heart's chambers and valves are working. This procedure takes approximately one hour. There are no restrictions for this procedure. Please do NOT wear cologne, perfume, aftershave, or lotions (deodorant is allowed). Please arrive 15 minutes prior to your appointment time.  Please note: We ask at that you not bring children with you during ultrasound (echo/ vascular) testing. Due to room size and safety concerns, children are not allowed in the ultrasound rooms during exams. Our front office staff cannot provide observation of children in our lobby area while testing is being conducted. An adult accompanying a patient to their appointment will only be allowed in the ultrasound room at the discretion of the ultrasound technician under special circumstances. We apologize for any inconvenience.    Follow-Up: At [facility], you and your health needs are our priority.  As part of our continuing mission to provide you with exceptional heart care, we have created designated Provider Care Teams.  These Care Teams  include your primary Cardiologist (physician) and Advanced Practice Providers (APPs -  Physician Assistants and Nurse Practitioners) who all work together to provide you with the care you need, when you need it.  We recommend signing up for the patient portal called "MyChart".  Sign up information is provided on this After Visit Summary.  MyChart is used to connect with patients for Virtual Visits (Telemedicine).  Patients are able to view lab/test results, encounter notes, upcoming appointments, etc.  Non-urgent messages can be sent to your provider as well.   To learn more about what you can do with MyChart, go to [email or web address].    Your next appointment:   12 month(s)  Provider:   Dr Jacinto Halim    Other Instructions

## 2023-03-22 NOTE — Progress Notes (Signed)
Cardiology Office Note:  .   Date:  03/22/2023  ID:  Douglas Khan, DOB 29-Aug-1955, MRN [id number] PCP: Wilfred Curtis, MD  [facility] Providers Cardiologist:  Yates Decamp, MD   History of Present Illness: .   Douglas Khan is a 68 y.o. 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  Cardiac catheterization antivenin a focal ostial 65% ramus intermediate stenosis and ostial D1 75% stenosis but otherwise no significant disease, EF was 20 to 30%.  With medical therapy he has completely recovered his EF and has maintained sinus rhythm and she addition was made for a 71-month office visit.  He remains asymptomatic.  Discussed the use of AI scribe software for clinical note transcription with the patient, who gave verbal consent to proceed.  History of Present Illness   The patient, with a history of heart failure, paroxysmal atrial fibrillation, and sepsis, presents for a routine follow-up. He reports feeling generally well, with no new or worsening symptoms. He has been managing his psoriasis with acitretin, which has been effective in reducing the severity of his skin lesions. He has been adhering to his current cardiac medications, including metoprolol, Xarelto, and Entresto, and reports no adverse effects. He expresses concern about the cost of Entresto and is considering switching to valsartan. He has not experienced any swelling in his feet and his blood pressure is well-controlled. He has been retired for ten years and spends his time taking care of his remaining parent.      Labs   Lab Results  Component Value Date   NA 142 04/05/2021   K 4.2 04/05/2021   CO2 17 (L) 04/05/2021   GLUCOSE 91 04/05/2021   BUN 17 04/05/2021   CREATININE 1.29 (H) 04/05/2021   CALCIUM 9.6 04/05/2021   EGFR 61 04/05/2021    GFRNONAA 59 (L) 06/23/2015      Latest Ref Rng & Units 04/05/2021   10:21 AM 06/23/2015   10:36 AM 09/08/2011    2:20 PM  BMP  Glucose 70 - 99 mg/dL 91  644  034   BUN 8 - 27 mg/dL 17  14  22    Creatinine 0.76 - 1.27 mg/dL 7.42  5.95  6.38   BUN/Creat Ratio 10 - 24 13     Sodium 134 - 144 mmol/L 142  139  136   Potassium 3.5 - 5.2 mmol/L 4.2  4.2  3.5   Chloride 96 - 106 mmol/L 108  109  102   CO2 20 - 29 mmol/L 17  21  22    Calcium 8.6 - 10.2 mg/dL 9.6  9.5  9.1       Latest Ref Rng & Units 06/23/2015   10:36 AM 09/08/2011    2:20 PM 09/26/2006    3:36 PM  CBC  WBC 4.0 - 10.5 K/uL 8.8  8.3  8.2   Hemoglobin 13.0 - 17.0 g/dL 75.6  43.3  29.5   Hematocrit 39.0 - 52.0 % 51.7  43.7  47.4   Platelets 150 - 400 K/uL 204  204  265    External Labs:  Care Everywhere labs 11/07/2022:  TSH normal at 1.680, free T4 normal at 0.96.  Total testosterone 346.  Free testosterone mildly reduced at 5.7 (6.6-18.1).  Hb 16.1/HCT 48.8, platelets 171, normal indicis.  Serum glucose 118 mg, BUN 20, creatinine 1.10,  EGFR 73 mL.  Total cholesterol 150, triglycerides 94, HDL 52, LDL 80.  Review of Systems  Cardiovascular:  Negative for chest pain, dyspnea on exertion and leg swelling.   Physical Exam:   VS:  BP 138/76 (BP Location: Right Arm, Patient Position: Sitting, Cuff Size: Large)   Pulse 70   Resp 14   Ht 6' (1.829 m)   Wt (!) 306 lb (138.8 kg)   SpO2 96%   BMI 41.50 kg/m    Wt Readings from Last 3 Encounters:  03/22/23 (!) 306 lb (138.8 kg)  09/20/22 292 lb 3.2 oz (132.5 kg)  03/29/22 298 lb (135.2 kg)    Physical Exam Neck:     Vascular: Carotid bruit (bilateral soft bruit) present. No JVD.  Cardiovascular:     Rate and Rhythm: Normal rate and regular rhythm.     Pulses: Intact distal pulses.     Heart sounds: S1 normal and S2 normal. Murmur heard.     Early systolic murmur is present with a grade of 2/6 at the upper right sternal border radiating to the neck.     No gallop.   Pulmonary:     Effort: Pulmonary effort is normal.     Breath sounds: Normal breath sounds.  Abdominal:     General: Bowel sounds are normal.     Palpations: Abdomen is soft.  Musculoskeletal:     Right lower leg: No edema.     Left lower leg: No edema.    Studies Reviewed: .    Echocardiogram 09/14/2022: Left ventricle cavity is normal in size. Mild concentric hypertrophy of the left ventricle. Normal global wall motion. Normal LV systolic function with EF 53%. Indeterminate diastolic filling pattern.  Left atrial cavity is mildly dilated at 35.6 ml/m^2. Trileaflet aortic valve with mild aortic valve leaflet calcification. Mild aortic stenosis. Vmax 2.5 m/sec, mean PG 11 mmHg, AVA 1.9 cm by continuity equation. No regurgitation. Mild (Grade I) mitral regurgitation. Structurally normal tricuspid valve with trace regurgitation. Estimated pulmonary artery systolic pressure 31 mmHg.  Previous study on 05/24/2021 reported normal diastolic function.  Compared to external echocardiogram done 02/19/2021, EF has improved from 20 to 30%.  EKG:    EKG 09/20/2022: Normal sinus rhythm at the rate of 56 bpm, leftward axis, right bundle branch block.  No evidence of ischemia.  Normal QT interval.  Medications and allergies    Allergies  Allergen Reactions   Amoxicillin-Pot Clavulanate Nausea And Vomiting    Other reaction(s): Other GI issues with diarrhea and nausea    Dust Mite Extract     Other reaction(s): Other (See Comments) Congestion/sinusitis   Molds & Smuts     Other reaction(s): Other (See Comments) Congestion sinusitis   Pollen Extract     Other reaction(s): Other Congestion/sinusitis Congestion/sinusitis    Current Outpatient Medications:    acitretin (SORIATANE) 25 MG capsule, Take 25 mg by mouth daily before breakfast., Disp: , Rfl:    Calcium Citrate-Vitamin D3 315-6.25 MG-MCG TABS, Take 1 tablet by mouth daily., Disp: , Rfl:    ENTRESTO 49-51 MG, Take 1 tablet by  mouth 2 (two) times daily., Disp: , Rfl:    fexofenadine (ALLEGRA) 180 MG tablet, Take 180 mg by mouth daily., Disp: , Rfl:    fluticasone (FLONASE) 50 MCG/ACT nasal spray, Place 1 spray into both nostrils daily., Disp: , Rfl:    Lipoic Acid 150 MG CAPS, Take 300 mg by mouth 2 (two) times daily., Disp: , Rfl:  Magnesium Citrate 200 MG TABS, Take 1 tablet by mouth daily., Disp: , Rfl:    metoprolol succinate (TOPROL-XL) 50 MG 24 hr tablet, Take 1 tablet (50 mg total) by mouth daily. Take with or immediately following a meal., Disp: 90 tablet, Rfl: 3   pregabalin (LYRICA) 100 MG capsule, Take 100 mg by mouth daily., Disp: , Rfl:    Probiotic Product (PROBIOTIC DAILY PO), Take 1 tablet by mouth daily., Disp: , Rfl:    rosuvastatin (CRESTOR) 5 MG tablet, TAKE 1 TABLET (5 MG TOTAL) BY MOUTH DAILY., Disp: 90 tablet, Rfl: 3   Saw Palmetto, Serenoa repens, 320 MG CAPS, Take 320 mg by mouth daily., Disp: , Rfl:    sodium chloride (OCEAN) 0.65 % nasal spray, Place 1 spray into the nose daily as needed for congestion., Disp: , Rfl:    tadalafil (CIALIS) 5 MG tablet, Take 5 mg by mouth daily as needed for erectile dysfunction., Disp: , Rfl:    testosterone (ANDROGEL) 50 MG/5GM GEL, Place 5 g onto the skin daily., Disp: , Rfl:    tiZANidine (ZANAFLEX) 4 MG tablet, Take 4 mg by mouth every 6 (six) hours as needed for muscle spasms., Disp: , Rfl:    traMADol (ULTRAM) 50 MG tablet, Take 50 mg by mouth every 6 (six) hours as needed for moderate pain. Reported on 06/23/2015, Disp: , Rfl:    XARELTO 20 MG TABS tablet, TAKE 1 TABLET BY MOUTH DAILY WITH SUPPER., Disp: 90 tablet, Rfl: 3   ASSESSMENT AND PLAN: .      ICD-10-CM   1. Paroxysmal atrial fibrillation (HCC)  I48.0 ECHOCARDIOGRAM COMPLETE    CANCELED: EKG 12-Lead    2. Primary hypertension  I10     3. Non-ischemic cardiomyopathy (HCC)  I42.8 ECHOCARDIOGRAM COMPLETE    4. Trace aortic stenosis by prior echocardiogram  I35.0 ECHOCARDIOGRAM COMPLETE     5. OSA on CPAP  G47.33      Click Here to Calculate/Change CHADS2VASc Score The patient's CHADS2-VASc score is 4, indicating a 4.8% annual risk of stroke.  Therefore, anticoagulation is recommended.   CHF History: Yes HTN History: Yes Diabetes History: No Stroke History: No Vascular Disease History: Yes   1. Paroxysmal atrial fibrillation (HCC) (Primary) Patient with paroxysmal atrial fibrillation, suspect it was precipitated by sepsis in January 2023 where he also developed acute renal failure and his EF was 20 to 30%.  His echocardiogram in August 2024 revealed ejection fraction of 53%, low normal.  He does have trace aortic stenosis.  No clinical evidence of heart failure.  We discussed extensively regarding continued anticoagulation in view of elevated CHA2DS2-VASc risk score although his risk factors have changed.  She had decision made to continue anticoagulation for now. - EKG 12-Lead  2. Primary hypertension Blood pressure is also well-controlled on present medical regimen including Entresto moderate dose along with metoprolol succinate 50 mg daily.  3. Non-ischemic cardiomyopathy (HCC) No clinical evidence of heart failure.  As his EF has improved, due to cost of the medication, as cardiomyopathy and A-fib was precipitated by sepsis and this is now resolved, I do not mind changing to valsartan and recheck his echocardiogram in a year.  Patient is in agreement, he will finish Sherryll Burger that he has at home and let us know when to send for valsartan Rx.  Also since sepsis has resolved, his renal function has steadily improved and latest external labs revealed normalization of renal function.  4. Trace aortic stenosis by prior echocardiogram  Clinical examination is consistent with mild aortic stenosis, we will follow-up on this by repeat echo in a year  5. OSA on CPAP .  He has been compliant with his CPAP.  Also this poses increased risk for recurrence of A-fib.  Obesity is also  contributing as a risk factor for A-fib.    Other orders - ENTRESTO 49-51 MG; Take 1 tablet by mouth 2 (two) times daily. - acitretin (SORIATANE) 25 MG capsule; Take 25 mg by mouth daily before breakfast. - tadalafil (CIALIS) 5 MG tablet; Take 5 mg by mouth daily as needed for erectile dysfunction.       Signed,  Yates Decamp, MD, [facility] 03/22/2023, 11:49 AM [facility] [address] [place], [place] [address] Phone: [phone]. Fax:  [phone]

## 2023-03-28 ENCOUNTER — Encounter: Payer: Self-pay | Admitting: Cardiology

## 2023-03-29 ENCOUNTER — Telehealth: Payer: Self-pay | Admitting: Cardiology

## 2023-03-29 DIAGNOSIS — I428 Other cardiomyopathies: Secondary | ICD-10-CM

## 2023-03-29 DIAGNOSIS — I10 Essential (primary) hypertension: Secondary | ICD-10-CM

## 2023-03-29 MED ORDER — VALSARTAN 160 MG PO TABS: 160.0000 mg | ORAL_TABLET | Freq: Every evening | ORAL | 3 refills | Status: DC

## 2023-03-29 NOTE — Telephone Encounter (Signed)
 ICD-10-CM   1. Non-ischemic cardiomyopathy (HCC)  I42.8 valsartan (DIOVAN) 160 MG tablet    2. Primary hypertension  I10 valsartan (DIOVAN) 160 MG tablet     Meds ordered this encounter  Medications   valsartan (DIOVAN) 160 MG tablet    Sig: Take 1 tablet (160 mg total) by mouth every evening.    Dispense:  90 tablet    Refill:  3    Discontinue Entresto    Medications Discontinued During This Encounter  Medication Reason   ENTRESTO 49-51 MG Change in therapy

## 2023-03-29 NOTE — Telephone Encounter (Signed)
 Pt c/o medication issue:  1. Name of Medication:   Valsartan  2. How are you currently taking this medication (dosage and times per day)?   Not started yet  3. Are you having a reaction (difficulty breathing--STAT)?   4. What is your medication issue?    Patient called to follow-up on the prescription for his new Valsartan medication to be sent to [id number] - [place], [place] - [address]  See also patient MyChart message.

## 2023-06-18 ENCOUNTER — Encounter: Payer: Self-pay | Admitting: Cardiology

## 2023-06-18 DIAGNOSIS — I2510 Atherosclerotic heart disease of native coronary artery without angina pectoris: Secondary | ICD-10-CM

## 2023-06-18 DIAGNOSIS — I480 Paroxysmal atrial fibrillation: Secondary | ICD-10-CM

## 2023-06-19 ENCOUNTER — Other Ambulatory Visit: Payer: Self-pay | Admitting: *Deleted

## 2023-06-19 DIAGNOSIS — I480 Paroxysmal atrial fibrillation: Secondary | ICD-10-CM

## 2023-06-19 MED ORDER — RIVAROXABAN 20 MG PO TABS: 20.0000 mg | ORAL_TABLET | Freq: Every day | ORAL | 1 refills | Status: AC

## 2023-06-19 MED ORDER — RIVAROXABAN 20 MG PO TABS: 20.0000 mg | ORAL_TABLET | Freq: Every day | ORAL | 1 refills | Status: DC

## 2023-06-19 MED ORDER — ROSUVASTATIN CALCIUM 5 MG PO TABS: 5.0000 mg | ORAL_TABLET | Freq: Every day | ORAL | 3 refills | Status: AC

## 2023-06-19 NOTE — Telephone Encounter (Signed)
 Prescription refill request for Xarelto  received.  Indication: PAF Last office visit:  03/22/23  Luwanna Sam MD Weight: 138.8kg Age: 68 Scr: 1.27 on 04/11/23  [facility] CrCl: 106.26  Based on above findings Xarelto  20mg  daily is the appropriate dose.  Refill approved.

## 2024-02-29 ENCOUNTER — Telehealth: Payer: Self-pay | Admitting: Cardiology

## 2024-02-29 DIAGNOSIS — I428 Other cardiomyopathies: Secondary | ICD-10-CM

## 2024-02-29 DIAGNOSIS — I10 Essential (primary) hypertension: Secondary | ICD-10-CM

## 2024-02-29 MED ORDER — VALSARTAN 160 MG PO TABS: 160.0000 mg | ORAL_TABLET | Freq: Every evening | ORAL | 0 refills | Status: AC

## 2024-02-29 MED ORDER — METOPROLOL SUCCINATE ER 50 MG PO TB24: 50.0000 mg | ORAL_TABLET | Freq: Every day | ORAL | 0 refills | Status: AC

## 2024-02-29 NOTE — Telephone Encounter (Signed)
" °*  STAT* If patient is at the pharmacy, call can be transferred to refill team.   1. Which medications need to be refilled? (please list name of each medication and dose if known)   valsartan  (DIOVAN ) 160 MG tablet   metoprolol  succinate (TOPROL -XL) 50 MG 24 hr tablet    2. Would you like to learn more about the convenience, safety, & potential cost savings by using the [facility]? No    3. Are you open to using the [facility] (Type [facility]. No    4. Which pharmacy/location (including street and city if local pharmacy) is medication to be sent to?  [id number] - [place], [place] - [address] [place] [address]     5. Do they need a 30 day or 90 day supply? 90 day   "

## 2024-02-29 NOTE — Telephone Encounter (Signed)
 Refill sent

## 2024-03-05 ENCOUNTER — Ambulatory Visit (HOSPITAL_COMMUNITY)
Admission: RE | Admit: 2024-03-05 | Discharge: 2024-03-05 | Payer: Medicare Other | Attending: Cardiology | Admitting: Cardiology

## 2024-03-05 DIAGNOSIS — I480 Paroxysmal atrial fibrillation: Secondary | ICD-10-CM

## 2024-03-05 DIAGNOSIS — I10 Essential (primary) hypertension: Secondary | ICD-10-CM

## 2024-03-05 DIAGNOSIS — I350 Nonrheumatic aortic (valve) stenosis: Secondary | ICD-10-CM

## 2024-03-05 DIAGNOSIS — I428 Other cardiomyopathies: Secondary | ICD-10-CM | POA: Diagnosis not present

## 2024-03-05 LAB — ECHOCARDIOGRAM COMPLETE
AR max vel: 1.83 cm2
AV Area VTI: 1.91 cm2
AV Area mean vel: 1.81 cm2
AV Mean grad: 8 mmHg
AV Peak grad: 13.9 mmHg
Ao pk vel: 1.87 m/s
Area-P 1/2: 3.56 cm2
S' Lateral: 2.9 cm

## 2024-03-06 ENCOUNTER — Ambulatory Visit: Payer: Self-pay | Admitting: Cardiology

## 2024-03-27 ENCOUNTER — Ambulatory Visit: Admitting: Cardiology
